# Patient Record
Sex: Female | Born: 1955 | Race: Black or African American | Hispanic: No | Marital: Married | Smoking: Never smoker
Health system: Southern US, Community
[De-identification: ages and names within clinical notes are randomized; demographics above are authoritative.]

## PROBLEM LIST (undated history)

## (undated) ENCOUNTER — Ambulatory Visit: Discharge status: Absent without leave | Payer: BC Managed Care – PPO

## (undated) DIAGNOSIS — I671 Cerebral aneurysm, nonruptured: Secondary | ICD-10-CM

## (undated) DIAGNOSIS — I10 Essential (primary) hypertension: Secondary | ICD-10-CM

## (undated) DIAGNOSIS — D72829 Elevated white blood cell count, unspecified: Secondary | ICD-10-CM

## (undated) DIAGNOSIS — E785 Hyperlipidemia, unspecified: Secondary | ICD-10-CM

## (undated) DIAGNOSIS — R519 Headache, unspecified: Secondary | ICD-10-CM

## (undated) HISTORY — PX: ABDOMINAL HYSTERECTOMY: SHX81

## (undated) HISTORY — DX: Cerebral aneurysm, nonruptured: I67.1

## (undated) HISTORY — DX: Hyperlipidemia, unspecified: E78.5

## (undated) HISTORY — DX: Elevated white blood cell count, unspecified: D72.829

## (undated) HISTORY — DX: Essential (primary) hypertension: I10

## (undated) HISTORY — PX: CEREBRAL ANEURYSM REPAIR: SHX164

## (undated) HISTORY — PX: OTHER SURGICAL HISTORY: SHX169

---

## 2005-03-23 ENCOUNTER — Other Ambulatory Visit: Admission: RE | Admit: 2005-03-23 | Discharge: 2005-03-23 | Payer: Self-pay | Admitting: Obstetrics and Gynecology

## 2005-04-05 ENCOUNTER — Encounter: Admission: RE | Admit: 2005-04-05 | Discharge: 2005-04-05 | Payer: Self-pay | Admitting: Obstetrics and Gynecology

## 2005-04-24 ENCOUNTER — Ambulatory Visit (HOSPITAL_COMMUNITY): Admission: RE | Admit: 2005-04-24 | Discharge: 2005-04-24 | Payer: Self-pay | Admitting: Gastroenterology

## 2005-04-24 ENCOUNTER — Encounter (INDEPENDENT_AMBULATORY_CARE_PROVIDER_SITE_OTHER): Payer: Self-pay | Admitting: Specialist

## 2005-04-26 ENCOUNTER — Encounter: Admission: RE | Admit: 2005-04-26 | Discharge: 2005-04-26 | Payer: Self-pay | Admitting: Obstetrics and Gynecology

## 2005-06-06 ENCOUNTER — Inpatient Hospital Stay (HOSPITAL_COMMUNITY): Admission: RE | Admit: 2005-06-06 | Discharge: 2005-06-09 | Payer: Self-pay | Admitting: Obstetrics and Gynecology

## 2005-06-06 ENCOUNTER — Encounter (INDEPENDENT_AMBULATORY_CARE_PROVIDER_SITE_OTHER): Payer: Self-pay | Admitting: *Deleted

## 2007-02-22 ENCOUNTER — Inpatient Hospital Stay (HOSPITAL_COMMUNITY): Admission: EM | Admit: 2007-02-22 | Discharge: 2007-02-28 | Payer: Self-pay | Admitting: Neurology

## 2007-02-22 ENCOUNTER — Encounter: Payer: Self-pay | Admitting: Emergency Medicine

## 2007-03-13 ENCOUNTER — Encounter: Payer: Self-pay | Admitting: Interventional Radiology

## 2007-04-10 ENCOUNTER — Encounter: Payer: Self-pay | Admitting: Interventional Radiology

## 2007-04-23 ENCOUNTER — Emergency Department (HOSPITAL_COMMUNITY): Admission: EM | Admit: 2007-04-23 | Discharge: 2007-04-23 | Payer: Self-pay | Admitting: Emergency Medicine

## 2007-04-24 ENCOUNTER — Encounter: Payer: Self-pay | Admitting: Interventional Radiology

## 2007-05-03 ENCOUNTER — Emergency Department (HOSPITAL_COMMUNITY): Admission: EM | Admit: 2007-05-03 | Discharge: 2007-05-03 | Payer: Self-pay | Admitting: Emergency Medicine

## 2007-05-22 ENCOUNTER — Ambulatory Visit: Payer: Self-pay | Admitting: Internal Medicine

## 2007-05-22 DIAGNOSIS — I671 Cerebral aneurysm, nonruptured: Secondary | ICD-10-CM

## 2007-05-22 DIAGNOSIS — I10 Essential (primary) hypertension: Secondary | ICD-10-CM

## 2007-05-22 DIAGNOSIS — E785 Hyperlipidemia, unspecified: Secondary | ICD-10-CM | POA: Insufficient documentation

## 2007-05-24 LAB — CONVERTED CEMR LAB
Albumin: 4.4 g/dL (ref 3.5–5.2)
Alkaline Phosphatase: 62 units/L (ref 39–117)
BUN: 7 mg/dL (ref 6–23)
Basophils Relative: 0.4 % (ref 0.0–1.0)
Calcium: 9.3 mg/dL (ref 8.4–10.5)
Creatinine, Ser: 0.8 mg/dL (ref 0.4–1.2)
Eosinophils Absolute: 0 10*3/uL (ref 0.0–0.7)
Eosinophils Relative: 0.9 % (ref 0.0–5.0)
GFR calc Af Amer: 97 mL/min
GFR calc non Af Amer: 80 mL/min
Glucose, Bld: 73 mg/dL (ref 70–99)
HCT: 41.4 % (ref 36.0–46.0)
Hemoglobin: 13.3 g/dL (ref 12.0–15.0)
MCV: 82.5 fL (ref 78.0–100.0)
Monocytes Absolute: 0.4 10*3/uL (ref 0.1–1.0)
Monocytes Relative: 10.5 % (ref 3.0–12.0)
Neutro Abs: 1.7 10*3/uL (ref 1.4–7.7)
Platelets: 280 10*3/uL (ref 150–400)
TSH: 0.53 microintl units/mL (ref 0.35–5.50)
Total Protein: 8.1 g/dL (ref 6.0–8.3)
WBC: 4.1 10*3/uL — ABNORMAL LOW (ref 4.5–10.5)

## 2007-06-24 ENCOUNTER — Inpatient Hospital Stay (HOSPITAL_COMMUNITY): Admission: RE | Admit: 2007-06-24 | Discharge: 2007-06-26 | Payer: Self-pay | Admitting: Interventional Radiology

## 2007-07-10 ENCOUNTER — Encounter: Payer: Self-pay | Admitting: Interventional Radiology

## 2007-09-16 ENCOUNTER — Ambulatory Visit (HOSPITAL_COMMUNITY): Admission: RE | Admit: 2007-09-16 | Discharge: 2007-09-16 | Payer: Self-pay | Admitting: Interventional Radiology

## 2008-01-22 ENCOUNTER — Inpatient Hospital Stay (HOSPITAL_COMMUNITY): Admission: RE | Admit: 2008-01-22 | Discharge: 2008-01-24 | Payer: Self-pay | Admitting: Interventional Radiology

## 2008-02-26 ENCOUNTER — Telehealth: Payer: Self-pay | Admitting: Internal Medicine

## 2008-02-28 ENCOUNTER — Encounter: Payer: Self-pay | Admitting: Interventional Radiology

## 2008-04-21 ENCOUNTER — Telehealth: Payer: Self-pay | Admitting: Internal Medicine

## 2008-06-05 ENCOUNTER — Ambulatory Visit (HOSPITAL_COMMUNITY): Admission: RE | Admit: 2008-06-05 | Discharge: 2008-06-05 | Payer: Self-pay | Admitting: Interventional Radiology

## 2008-12-16 ENCOUNTER — Ambulatory Visit (HOSPITAL_COMMUNITY): Admission: RE | Admit: 2008-12-16 | Discharge: 2008-12-16 | Payer: Self-pay | Admitting: Interventional Radiology

## 2009-02-26 ENCOUNTER — Telehealth: Payer: Self-pay | Admitting: Internal Medicine

## 2009-05-13 ENCOUNTER — Ambulatory Visit: Payer: Self-pay | Admitting: Internal Medicine

## 2009-05-14 LAB — CONVERTED CEMR LAB
BUN: 11 mg/dL (ref 6–23)
Chloride: 104 meq/L (ref 96–112)
Cholesterol: 203 mg/dL — ABNORMAL HIGH (ref 0–200)
GFR calc non Af Amer: 124.33 mL/min (ref >60)
Potassium: 4.1 meq/L (ref 3.5–5.1)
Sodium: 142 meq/L (ref 135–145)
Triglycerides: 86 mg/dL (ref 0.0–149.0)
VLDL: 17.2 mg/dL (ref 0.0–40.0)

## 2009-09-03 ENCOUNTER — Emergency Department (HOSPITAL_COMMUNITY): Admission: EM | Admit: 2009-09-03 | Discharge: 2009-09-03 | Payer: Self-pay | Admitting: Family Medicine

## 2009-09-27 ENCOUNTER — Ambulatory Visit (HOSPITAL_COMMUNITY): Admission: RE | Admit: 2009-09-27 | Discharge: 2009-09-27 | Payer: Self-pay | Admitting: Oncology

## 2009-12-08 ENCOUNTER — Emergency Department (HOSPITAL_COMMUNITY)
Admission: EM | Admit: 2009-12-08 | Discharge: 2009-12-08 | Payer: Self-pay | Source: Home / Self Care | Admitting: Emergency Medicine

## 2010-01-23 ENCOUNTER — Encounter: Payer: Self-pay | Admitting: Obstetrics and Gynecology

## 2010-02-01 NOTE — Assessment & Plan Note (Signed)
Summary: fu on bp/njr   Vital Signs:  Patient profile:   55 year old female Menstrual status:  hysterectomy Weight:      145 pounds BMI:     26.62 Temp:     98.8 degrees F oral Pulse rate:   68 / minute Pulse rhythm:   regular Resp:     12 per minute BP sitting:   126 / 88  (left arm) Cuff size:   regular  Vitals Entered By: Gladis Riffle, RN (May 13, 2009 9:22 AM) CC: FU BP--states BP ok at home Is Patient Diabetic? No     Menstrual Status hysterectomy   CC:  FU BP--states BP ok at home.  History of Present Illness:  Follow-Up Visit      This is a 55 year old woman who presents for Follow-up visit.  The patient denies chest pain and palpitations.  Since the last visit the patient notes no new problems or concerns.  The patient reports taking meds as prescribed.  When questioned about possible medication side effects, the patient notes none.    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 Once Daily 2)  Ibuprofen 200 Mg  Tabs (Ibuprofen) .... As Needed 3)  Lisinopril 10 Mg  Tabs (Lisinopril) .... Take 1 Tab By Mouth Daily  Allergies: 1)  ! Sulfamethoxazole (Sulfamethoxazole)  Past History:  Past Medical History: Last updated: 06-03-07 Hyperlipidemia Hypertension brain aneurysm  Past Surgical History: Last updated: 2007-06-03 Hysterectomy cerebral aneurysm repair vaginal deliveries x 3  Family History: Last updated: 2007-06-03 mother deceased "heart" 46s father deceased DM--60s Family History of CAD Female 1st degree relative <60 Family History Diabetes 1st degree relative  Social History: Last updated: 2007-06-03 Occupation: out of work since  aneurysm Single 3 kids healthy Never Smoked Alcohol use-no Regular exercise-no  Risk Factors: Exercise: no (06/03/2007)  Risk Factors: Smoking Status: never (05/13/2009)  Physical Exam  General:  alert and well-developed.   Head:  normocephalic and atraumatic.   Eyes:  pupils equal and pupils round.   Ears:  R ear normal and L ear normal.   Neck:  No deformities, masses, or tenderness noted. Chest Wall:  No deformities, masses, or tenderness noted. Lungs:  Normal respiratory effort, chest expands symmetrically. Lungs are clear to auscultation, no crackles or wheezes. Abdomen:  Bowel sounds positive,abdomen soft and non-tender without masses, organomegaly or hernias noted. Msk:  No deformity or scoliosis noted of thoracic or lumbar spine.   Pulses:  R radial normal and L radial normal.   Neurologic:  cranial nerves II-XII intact and gait normal.     Impression & Recommendations:  Problem # 1:  HYPERTENSION (ICD-401.9)  check labs today  BP at home 110s 60s Her updated medication list for this problem includes:    Lisinopril 10 Mg Tabs (Lisinopril) .Marland Kitchen... Take 1 tab by mouth daily  BP today: 126/88 Prior BP: 154/96 (06-03-2007)  Labs Reviewed: K+: 4.2 (Jun 03, 2007) Creat: : 0.8 (June 03, 2007)     Orders: Venipuncture (04540) TLB-BMP (Basic Metabolic Panel-BMET) (80048-METABOL)  Problem # 2:  CEREBRAL ANEURYSM (ICD-437.3) no recurrence  Problem # 3:  HYPERLIPIDEMIA (ICD-272.4)  needs labs drqwn Labs Reviewed: SGOT: 19 (06/03/07)   SGPT: 14 (Jun 03, 2007)  Orders: TLB-Lipid Panel (80061-LIPID) TLB-TSH (Thyroid Stimulating Hormone) (84443-TSH)  Complete Medication List: 1)  Aspirin Ec 325 Mg Tbec (Aspirin) .... Once daily 2)  Ibuprofen 200 Mg Tabs (Ibuprofen) .... As needed 3)  Lisinopril 10 Mg Tabs (Lisinopril) .... Take 1 tab by mouth daily  Patient Instructions: 1)  Please schedule a follow-up appointment in 6 months. Prescriptions: LISINOPRIL 10 MG   TABS (LISINOPRIL) Take 1 tab by mouth daily  #90 x 3   Entered and Authorized by:   Birdie Sons MD   Signed by:   Birdie Sons MD on 05/13/2009   Method used:   Electronically to        [facility] [address] [phone]* (retail)       [address]       [place], [place]  [address]       Ph: [phone] or [phone]       Fax: [phone]   RxID:   [id number]    Immunization History:  Tetanus/Td Immunization History:    Tetanus/Td:  historical (01/03/2007)

## 2010-02-01 NOTE — Progress Notes (Signed)
Summary: REFILL  Phone Note Refill Request Message from:  Fax from Pharmacy  Refills Requested: Medication #1:  LISINOPRIL 10 MG  TABS Take 1 tab by mouth daily. [facility] [phone]    FAX---[phone]  Initial call taken by: Warnell Forester,  February 26, 2009 9:08 AM  Follow-up for Phone Call        denied.  patient needs an office visit. Follow-up by: Kern Reap CMA Duncan Dull),  February 26, 2009 9:18 AM  Additional Follow-up for Phone Call Additional follow up Details #1::        faxed back as denied---needs ov. Additional Follow-up by: Warnell Forester,  February 26, 2009 9:42 AM     Appended Document: REFILL [facility]

## 2010-03-17 LAB — BASIC METABOLIC PANEL
CO2: 25 mEq/L (ref 19–32)
Chloride: 110 mEq/L (ref 96–112)
Creatinine, Ser: 0.76 mg/dL (ref 0.4–1.2)
GFR calc Af Amer: >60 mL/min (ref >60)
Glucose, Bld: 97 mg/dL (ref 70–99)

## 2010-03-17 LAB — URINE CULTURE
Colony Count: 85000
Culture  Setup Time: 201109021220

## 2010-03-17 LAB — POCT URINALYSIS DIPSTICK
Bilirubin Urine: NEGATIVE
Ketones, ur: NEGATIVE mg/dL
Protein, ur: NEGATIVE mg/dL
pH: 6 (ref 5.0–8.0)

## 2010-03-17 LAB — CBC
Hemoglobin: 13 g/dL (ref 12.0–15.0)
MCH: 26.2 pg (ref 26.0–34.0)
MCHC: 32.4 g/dL (ref 30.0–36.0)
MCV: 80.8 fL (ref 78.0–100.0)
RBC: 4.96 MIL/uL (ref 3.87–5.11)

## 2010-04-05 LAB — BASIC METABOLIC PANEL
Calcium: 8.6 mg/dL (ref 8.4–10.5)
Creatinine, Ser: 0.67 mg/dL (ref 0.4–1.2)
GFR calc Af Amer: >60 mL/min (ref >60)
GFR calc non Af Amer: >60 mL/min (ref >60)

## 2010-04-05 LAB — PROTIME-INR
INR: 0.95 (ref 0.00–1.49)
Prothrombin Time: 12.6 seconds (ref 11.6–15.2)

## 2010-04-05 LAB — CBC
MCHC: 33.5 g/dL (ref 30.0–36.0)
RBC: 4.88 MIL/uL (ref 3.87–5.11)
WBC: 3.2 10*3/uL — ABNORMAL LOW (ref 4.0–10.5)

## 2010-04-05 LAB — APTT: aPTT: 22 seconds — ABNORMAL LOW (ref 24–37)

## 2010-04-11 LAB — BASIC METABOLIC PANEL
Calcium: 9.2 mg/dL (ref 8.4–10.5)
GFR calc Af Amer: >60 mL/min (ref >60)
GFR calc non Af Amer: >60 mL/min (ref >60)
Glucose, Bld: 95 mg/dL (ref 70–99)
Sodium: 142 mEq/L (ref 135–145)

## 2010-04-11 LAB — CBC
Hemoglobin: 13.2 g/dL (ref 12.0–15.0)
Platelets: 250 10*3/uL (ref 150–400)
RDW: 13.5 % (ref 11.5–15.5)
WBC: 3 10*3/uL — ABNORMAL LOW (ref 4.0–10.5)

## 2010-04-11 LAB — APTT: aPTT: 26 seconds (ref 24–37)

## 2010-04-11 LAB — PROTIME-INR: INR: 1 (ref 0.00–1.49)

## 2010-04-18 LAB — CBC
HCT: 33.4 % — ABNORMAL LOW (ref 36.0–46.0)
Hemoglobin: 10.9 g/dL — ABNORMAL LOW (ref 12.0–15.0)
MCHC: 32.5 g/dL (ref 30.0–36.0)
Platelets: 273 10*3/uL (ref 150–400)
Platelets: 248 10*3/uL (ref 150–400)
RBC: 4.36 MIL/uL (ref 3.87–5.11)
WBC: 4.2 10*3/uL (ref 4.0–10.5)
WBC: 5 10*3/uL (ref 4.0–10.5)
WBC: 5.7 10*3/uL (ref 4.0–10.5)

## 2010-04-18 LAB — BASIC METABOLIC PANEL
BUN: 10 mg/dL (ref 6–23)
Calcium: 8.8 mg/dL (ref 8.4–10.5)
Creatinine, Ser: 0.81 mg/dL (ref 0.4–1.2)
Creatinine, Ser: 0.53 mg/dL (ref 0.4–1.2)
GFR calc Af Amer: >60 mL/min (ref >60)
GFR calc non Af Amer: >60 mL/min (ref >60)
Potassium: 4.2 mEq/L (ref 3.5–5.1)

## 2010-04-18 LAB — HEPARIN LEVEL (UNFRACTIONATED)
Heparin Unfractionated: 0.3 IU/mL (ref 0.30–0.70)
Heparin Unfractionated: 0.51 IU/mL (ref 0.30–0.70)

## 2010-04-18 LAB — APTT: aPTT: 33 seconds (ref 24–37)

## 2010-04-18 LAB — DIFFERENTIAL
Lymphocytes Relative: 47 % — ABNORMAL HIGH (ref 12–46)
Lymphs Abs: 2 10*3/uL (ref 0.7–4.0)
Neutrophils Relative %: 42 % — ABNORMAL LOW (ref 43–77)

## 2010-04-18 LAB — PROTIME-INR
INR: 0.9 (ref 0.00–1.49)
Prothrombin Time: 12.1 seconds (ref 11.6–15.2)

## 2010-05-17 NOTE — H&P (Signed)
Krystal Houston, Krystal Houston               ACCOUNT NO.:  [id number]   MEDICAL RECORD NO.:  [id number]          PATIENT TYPE:  OIB   LOCATION:  3112                         FACILITY:  [facility]   PHYSICIAN:  Sanjeev K. Deveshwar, M.D.DATE OF BIRTH:  11-03-1955   DATE OF ADMISSION:  06/24/2007  DATE OF DISCHARGE:                              HISTORY & PHYSICAL   CHIEF COMPLAINT:  Cerebral aneurysm.   HISTORY OF PRESENT ILLNESS:  This is a pleasant 55 year old female who  is referred to Dr. Corliss Skains through the courtesy of Dr. Sharene Skeans.  The  patient had a cerebral angiogram performed by Dr. 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  The  patient underwent stent-assisted coiling of the larger of the 2  aneurysms on February 27, 2007.  She returns today to be admitted for a  repeat cerebral angiogram and possible coiling of the right internal  carotid artery aneurysm.   PAST MEDICAL HISTORY:  Significant for hypertension, anemia,  diverticular disease, and the above-noted aneurysms.   SURGICAL HISTORY:  The patient is status post hysterectomy and status  post bilateral tubal ligation.   ALLERGIES:  She is allergic to SULFA.   CURRENT MEDICATIONS:  The patient did not bring a list of her  medication.  She did bring the pills, although they are not in the  original bottles.  She states she is on a blood pressure medicine  started by Dr. Cato Mulligan.  She also has been taking her aspirin and Plavix  as instructed in anticipation of possible stenting.   SOCIAL HISTORY:  The patient is separated.  She has 3 children.  She is  currently unemployed.  She does not use alcohol or tobacco.   FAMILY HISTORY:  Her mother died from heart disease at age 63.  Her  father died from complications of diabetes at age 44.  There  is no known  family history of aneurysms.   REVIEW OF SYSTEMS:  Completely negative.   LABORATORY DATA:  An INR is 0.9, PTT is 26, BUN is 9, creatinine 0.77,  and GFR greater than 60.  CBC revealed hemoglobin 13.1, hematocrit 39.3,  WBC is 3300, and platelets 252,000.   PHYSICAL EXAM:  GENERAL:  Reveals a pleasant 55 year old African  American female in no acute distress.  HEENT:  Unremarkable.  NECK:  Revealed no bruits.  HEART:  Revealed regular rate and rhythm without murmur.  LUNGS:  Clear.  ABDOMEN:  Soft and nontender.  EXTREMITIES:  Reveal pulses to be intact, although the pulses of the  right lower extremity appear to be somewhat stronger than the left.  There is no significant edema.  Her airway was rated at 68.  Her ASA  scale was rated at 3.  NEUROLOGIC:  Mental status, the patient was alert and oriented, follows  commands.  Cranial nerves II through XII grossly intact.  Sensation was  intact to light  touch.  Motor strength was 5/5 throughout.  Cerebellar  testing was intact.   IMPRESSION:  1. Status post stent-assisted coiling of a large left internal carotid      artery aneurysm performed on February 27, 2007, by Dr. Corliss Skains.  2. 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  3. History of anemia.  4. History of hypertension.  5. History of diverticular disease.  6. History of sinusitis.  7. Status post tubal ligation and hysterectomy.  8. History of allergy to SULFA.   PLAN:  As noted, the patient will have a repeat cerebral angiogram today  and possible stenting and/or coiling of her right internal carotid  artery aneurysm to be performed under general anesthesia by Dr.  Corliss Skains.      Delton See, P.A.    ______________________________  Grandville Silos. Corliss Skains, M.D.    DR/MEDQ  D:  06/24/2007  T:  06/25/2007  Job:  [id number]   cc:   Valetta Mole. Swords, MD   [name], M.D.  [name], M.D.

## 2010-05-17 NOTE — Discharge Summary (Signed)
Krystal Houston, Krystal Houston               ACCOUNT NO.:  [id number]   MEDICAL RECORD NO.:  [id number]          PATIENT TYPE:  INP   LOCATION:  3112                         FACILITY:  [facility]   PHYSICIAN:  Sanjeev K. Deveshwar, M.D.DATE OF BIRTH:  09-01-1955   DATE OF ADMISSION:  06/24/2007  DATE OF DISCHARGE:  06/26/2007                               DISCHARGE SUMMARY   ADDENDUM   Please refer to discharge summary dictated on June 25, 2007.  The  patient was kept in the hospital an extra day and was not discharged  until June 26, 2007, because of some bleeding from her groin, which  occurred following her bedrest after the right femoral groin sheath had  been removed.  Due to the unstable nature of this bleed, Dr. Corliss Houston  felt that it would be best to keep the patient in the hospital an extra  day.   The patient also complained of some urinary symptoms.  A urinalysis was  performed prior to discharge and was completely negative.      Krystal Houston, P.A.    ______________________________  Krystal Houston, M.D.    DR/MEDQ  D:  06/27/2007  T:  06/28/2007  Job:  [id number]

## 2010-05-17 NOTE — Discharge Summary (Signed)
Krystal Houston, Krystal Houston               ACCOUNT NO.:  [id number]   MEDICAL RECORD NO.:  [id number]          PATIENT TYPE:  OIB   LOCATION:  3112                         FACILITY:  [facility]   PHYSICIAN:  Sanjeev K. Deveshwar, M.D.DATE OF BIRTH:  1955-12-10   DATE OF ADMISSION:  06/24/2007  DATE OF DISCHARGE:  06/25/2007                               DISCHARGE SUMMARY   CHIEF COMPLAINT:  Cerebral aneurysms.   HISTORY OF PRESENT ILLNESS:  This is a pleasant 55 year old female who  was referred to Dr. Corliss Skains through the courtesy of Dr. Sharene Skeans.  The  patient had a cerebral angiogram performed by Dr. 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  The  patient underwent stent assisted coiling of the larger of the 2  aneurysms on February 27, 2007.  She was readmitted to [facility] on June 24, 2007, for treatment of the remaining right internal  carotid artery aneurysm.   PAST MEDICAL HISTORY:  Significant for hypertension, anemia,  diverticular disease, and the above-noted aneurysms.   SURGICAL HISTORY:  The patient is status post hysterectomy, status post  bilateral tubal ligation.   ALLERGIES:  She is allergic to SULFA.   MEDICATIONS:  The patient was on aspirin 1 daily, mg dose is not  available; Lotensin 10 mg daily.  She was started on Plavix in  anticipation of this procedure.  She also takes a multivitamin daily and  ibuprofen.   SOCIAL HISTORY:  The patient is separated.  She has 3 children.  She is  currently unemployed.  She does not use alcohol or tobacco.   FAMILY HISTORY:  Her mother died from heart disease at age 66.  Her  father died from complications of diabetes at age 27.  There is no known  family history of cerebral aneurysms.   HOSPITAL COURSE:  As noted, this patient was  admitted to [facility] on June 24, 2007, for treatment of a remaining right internal  carotid artery aneurysm.  A cerebral angiogram was performed under  conscious sedation on the day of admission.  The aneurysm was once again  identified.  Dr. Corliss Skains recommended stent-assisted coiling.  A stent  was placed; however, the coiling could not be accomplished.  It was felt  that the stent would need time to endothelialize and the coiling could  possibly be performed at a later date.   Following the procedure, the patient was admitted to the neurointensive  care unit where she remained on IV heparin overnight.  The following  day, the heparin was discontinued.  She did complain of some mild  headaches; however, they had resolved by the following morning.  The  right femoral groin sheath was removed.  She was noted to be mildly  anemic, which was felt secondary to hydration with IV fluids and minimal  blood loss.  Plan at this time is to continue bedrest for 6 hours,  after  bedrest she will be ambulated.  If she remains stable, plan will be for  discharge later this afternoon.   LABORATORY DATA:  A CBC on June 20, 2007, revealed hemoglobin 13.1,  hematocrit 39.3, WBCs were 3300, and platelets 252,000.  A CBC on the  day of discharge revealed hemoglobin 10.7, hematocrit 31.5, WBCs 4000,  and platelets 217,000.  A basic metabolic panel on the day of discharge  revealed BUN 6, creatinine 0.8, and potassium was 3.6.  GFR was greater  than 60, glucose was 88, and sodium 141.   DISCHARGE MEDICATIONS:  The patient was told to continue the same  medications she was on prior to admission.  She will continue on Plavix  until told to stop this medication by Dr. Corliss Skains.   The patient was told to resume her previous diet.  She was given  instructions regarding wound care.  She was not to do anything strenuous  including driving for at least 2 weeks.   The patient was told to follow up  with Dr. Cato Mulligan as needed or as  instructed.  She would follow up with Dr. Corliss Skains in approximately 2  weeks.  She will probably have a repeat cerebral angiogram in 3 months.   DISCHARGE DIAGNOSES:  1. Status post stenting of a 2- to 3-mm cavernous cave aneurysm of the      right internal carotid artery performed on June 24, 2007.  2. Status post stent assisted coiling of a large left internal carotid      artery aneurysm performed on February 27, 2007.  3. History of hypertension.  4. Mild anemia.  5. Diverticular disease.  6. History of sinusitis.  7. Status post tubal ligation and hysterectomy.  8. History of allergy to SULFA.      Delton See, P.A.    ______________________________  Grandville Silos. Corliss Skains, M.D.    DR/MEDQ  D:  06/25/2007  T:  06/26/2007  Job:  [id number]   cc:   Valetta Mole. Swords, MD  [name], M.D.  [name], M.D.

## 2010-05-17 NOTE — Consult Note (Signed)
NAMESALIHAH, PECKHAM               ACCOUNT NO.:  [id number]   MEDICAL RECORD NO.:  [id number]          PATIENT TYPE:  OUT   LOCATION:  XRAY                         FACILITY:  [facility]   PHYSICIAN:  Sanjeev K. Deveshwar, M.D.DATE OF BIRTH:  01-19-1955   DATE OF CONSULTATION:  04/24/2007  DATE OF DISCHARGE:                                 CONSULTATION   DATE OF CONSULT:  April 24, 2007   CHIEF COMPLAINT:  Status post stent assisted coiling of a left internal  carotid artery aneurysm.   BRIEF HISTORY:  This is a very pleasant 55 year old female who was  referred to Dr. Corliss Skains through the courtesy of Dr. Sharene Skeans.  The  patient had had a cerebral angiogram performed by Dr. Alfredo Batty on  February 23, 2007.  It revealed a 14-mm mostly thrombosed left cavernous  carotid artery aneurysm that involved a long segment of the internal  carotid artery estimated to be 10 mm.  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  On  February 27, 2007, the patient underwent stent assisted coiling of the  left internal carotid artery aneurysm performed by Dr. Corliss Skains.  She  was seen in follow-up on at least two different occasions.  At one  point, she was having some headaches and pain behind her left eye.  She  was told to take ibuprofen on the followup visit.  She reported that her  symptoms had resolved.   We received a phone call from the patient yesterday saying that she felt  strange she lives alone.  She felt as if she was going to pass out.  She  had odd sensations in her head which she could not describe.  She was  told to come to [facility] for further  evaluation which she did.   The patient was evaluated in the emergency room last night.  A CT scan  was performed that showed no acute findings.  The patient also had some  other lab work performed which was unremarkable except for urinalysis  which was possibly  consistent with urinary tract infection.  The patient  was told to followup with Dr. Corliss Skains today.  She presents with her  sister for that followup.   PAST MEDICAL HISTORY:  1. Hypertension.  2. Anemia.  3. Diverticular disease.  4. Above-noted aneurysms.   SURGICAL HISTORY:  The patient is status post hysterectomy.   ALLERGIES:  She is allergic to SULFA.   SOCIAL HISTORY:  The patient is separated.  She has three children.  She  is currently unemployed.  She does not use alcohol or tobacco.   FAMILY HISTORY:  Her mother died from heart disease at age 50.  Her  father died from complications of diabetes at age 89.  There is no known  family history of aneurysms.   CURRENT MEDICATIONS:  1. Ibuprofen p.r.n.  2. Aspirin 325 mg daily.  3. She had previously been on Plavix which was discontinued at one of      her visits with  Dr. Corliss Skains.   IMPRESSION AND PLAN:  As noted, the patient presents today accompanied  by her sister to meet with Dr. Corliss Skains after being seen in the  emergency department last night with odd sensations in her head.  The  chief complaint from the emergency department was severe headache,  although the patient denies having had a headache though she stated her  blood pressure was elevated which was at 157/99.  The patient does not  have a primary care physician.  We reviewed her labs which were  essentially unremarkable as was the CT scan of the head.  The urinalysis  did show findings consistent with a possible urinary tract infection.  Since the patient is allergic to sulfa, we did give her a prescription  for Cipro 250 mg to be taken one b.i.d. to be taken for 5 days.  The  patient was told to call us back in approximately a week or two to see  if her symptoms have resolved.   She has been tentatively scheduled for treatment of her remaining  aneurysm at the end of May.   The patient also reports that she stays tired and fatigued.  She has  also  had memory problems and difficulty concentrating.  Dr. Corliss Skains  did not feel that an MRI was indicated at this time.  However, if her  symptoms continued, he will consider this in the future.  We consider  the possibility of depression as a cause of her symptoms of fatigue and  memory problems.  Dr. Corliss Skains did not feel that it was related to her  aneurysms.  She may need a follow-up visit with Dr. Sharene Skeans.  She no  doubt needs a primary care physician.  Greater than 30 minutes was spent  on this consult.      Delton See, P.A.    ______________________________  Grandville Silos. Corliss Skains, M.D.    DR/MEDQ  D:  04/24/2007  T:  04/25/2007  Job:  [id number]   cc:   [name], M.D.  [name], M.D.

## 2010-05-17 NOTE — Discharge Summary (Signed)
NAMEJAZZELLE, Krystal Houston               ACCOUNT NO.:  [id number]   MEDICAL RECORD NO.:  [id number]          PATIENT TYPE:  INP   LOCATION:  3104                         FACILITY:  [facility]   PHYSICIAN:  Sanjeev K. Deveshwar, M.D.DATE OF BIRTH:  [birth year]   DATE OF ADMISSION:  01/22/2008  DATE OF DISCHARGE:  01/24/2008                               DISCHARGE SUMMARY   ADDENDUM   BRIEF HISTORY:  Please see the discharge summary dictated on January 23, 2008.  The plan was for the patient to be discharged home that evening;  however after ambulating with the nurse on the unit, the patient had  some dizziness.  She also complained of transient left lower extremity  weakness.  Dr. Corliss Skains was contacted.  A decision was made to place  the patient on IV heparin and to keep her overnight.  A CT scan of the  head showed no acute findings.  The patient felt significantly better  the following day.  The heparin was discontinued.  The patient remained  stable.  A decision was made to proceed with discharge per Dr.  Corliss Skains.      Delton See, P.A.    ______________________________  Grandville Silos. Corliss Skains, M.D.    DR/MEDQ  D:  01/24/2008  T:  01/24/2008  Job:  [id number]

## 2010-05-17 NOTE — Discharge Summary (Signed)
NAMEHINA, GUPTA               ACCOUNT NO.:  [id number]   MEDICAL RECORD NO.:  [id number]          PATIENT TYPE:  INP   LOCATION:  3104                         FACILITY:  [facility]   PHYSICIAN:  Sanjeev K. Deveshwar, M.D.DATE OF BIRTH:  13-Feb-1955   DATE OF ADMISSION:  01/22/2008  DATE OF DISCHARGE:  01/23/2008                               DISCHARGE SUMMARY   CHIEF COMPLAINT:  Cerebral aneurysms.   HISTORY OF PRESENT ILLNESS:  This is a pleasant 55 year old female who  was referred to Dr. Corliss Skains through the courtesy of Dr. Sharene Skeans.  The  patient had a cerebral angiogram performed by Dr. 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   On February 29, 2009, the patient underwent stent assisted coiling of  the larger of the two aneurysms performed by Dr. Corliss Skains.  She was  later re-admitted to [facility] on June 24, 2007 for treatment  of the remaining right internal carotid artery.  At that time, a stent  was placed.  It was felt that coiling was too risky and could possibly  be performed at a later time as a staged procedure.   The patient had a followup cerebral angiogram on September 16, 2007 that  showed near complete obliteration of the giant left paracavernous  aneurysm treated with stent assisted coiling.  There was an interval  decrease in the neck remnant along the inferior medial aspect of the  giant aneurysm.  The other aneurysm measured 2.5 mm x 2.1 mm in the  right internal carotid artery superior hypophyseal region and had a  patent stent at the base.  It was felt that the aneurysm was essentially  unchanged and further intervention was recommended.  The patient was  admitted to [facility] January 22, 2008 for further evaluation.   PAST MEDICAL HISTORY:  Please  see history above.  The patient also has a  history of hypertension, anemia and diverticular disease.   PAST SURGICAL HISTORY:  Significant for a hysterectomy as well as a  bilateral tubal ligation.  She denies any previous problems with  anesthesia.   ALLERGIES:  The patient is allergic to SULFA.   MEDICATIONS AT THE TIME OF ADMISSION:  Lisinopril, aspirin and Plavix.  The Plavix was started 3 days prior to admission in anticipation of  possible stent placement.  We also called and a prescription for  lisinopril for the patient at that time as she had run out of this  medication.   SOCIAL HISTORY:  The patient is separated.  She has three children.  She  is currently unemployed.  She does not use alcohol or tobacco.   FAMILY HISTORY:  Her mother died from heart disease at age 16.  Her  father died from complications of diabetes at age 8.  There is no known  family history of cerebral aneurysms.   HOSPITAL COURSE:  As noted, this patient was admitted  to [facility] on January 22, 2008 for further evaluation of a right internal  carotid artery aneurysm that had previously been stented.  She underwent  a cerebral angiogram shortly after admission.  Dr. Corliss Skains was unable  to coil the aneurysm instead a second stent was placed within the first  stent.  Dr. Corliss Skains felt strongly that this would divert enough flow  away from the aneurysm and the aneurysm would gradually shrink.   The patient tolerated the procedure well.  She was admitted to the neuro  intensive care unit and remained on IV heparin overnight.  The following  day, the IV heparin was discontinued.  The right femoral groin sheath  was removed.  The patient was ambulated.  She did have some mild  dizziness despite having a blood pressure of 140s systolic.  She was  given an extra 250 mL of normal saline prior to discharge.  Her  potassium was also noted to be low.  This was supplemented prior to  discharge.   She was also mildly anemic.  However, there were no signs of  ongoing bleeding.  Decision was made to ambulate the patient once again  and if stable to proceed with discharge.   LABORATORY DATA:  On the day of discharge, the basic metabolic panel  revealed a BUN of 5, creatinine 0.53, GFR was greater than 60, potassium  was 3.4, glucose was 129.  A CBC on the January 23, 2008 revealed  hemoglobin 11.5, hematocrit 35.4, WBCs 5.7000, platelets 260,000.  On  January 17, 2008 hemoglobin had been 12.9 and hematocrit 38.7.   The patient was told to resume her home medications as previously.  She  was also told to stay on Plavix until instructed otherwise by Dr.  Corliss Skains.  She is to continue aspirin 81 mg daily.   The patient was told to stay on a low-sodium diet.  She was given  instructions regarding wound care.  She was told not to drive for at  least 2 weeks.  She is to avoid any strenuous activity for 2 weeks.  A  followup angiogram was recommended in 3 months.  The patient would see  Dr. Corliss Skains Wednesday, February 05, 2008 at 2 p.m.Marland Kitchen  She was told to  follow up with Dr. Cato Mulligan as scheduled.  She was to have her blood  pressure evaluated as well as a CBC blood test to follow up on her  anemia.   PROGNOSIS AT THE TIME OF DISCHARGE:  1. Status post placement of a second stent in a previously stented      right internal carotid artery aneurysm performed on the day of      admission.  2. Previous stent to the right internal carotid artery performed on      June 24, 2007.  3. Stent-assisted coiling of a giant left cavernous carotid artery      aneurysm performed February 27, 2007.  4. History of hypertension with poor compliance.  5. History of mild anemia with mildly low hemoglobin and hematocrit      following her procedure.  6. History of diverticular disease.  7. Status post hysterectomy and bilateral tubal ligation.  8. Mildly low potassium supplemented prior to discharge.  9.  History of chronic sinusitis.      Delton See, P.A.    ______________________________  Grandville Silos. Corliss Skains, M.D.   DR/MEDQ  D:  01/23/2008  T:  01/24/2008  Job:  [id number]   cc:  Bruce Rexene Edison Swords, MD  [name], M.D.

## 2010-05-17 NOTE — Consult Note (Signed)
[name], [place] NO.:  [id number]   MEDICAL RECORD NO.:  [id number]          PATIENT TYPE:  EMS   LOCATION:  ED                           FACILITY:  [facility]   PHYSICIAN:  Deanna Artis. Hickling, M.D.DATE OF BIRTH:  [birth year]   DATE OF CONSULTATION:  02/22/2007  DATE OF DISCHARGE:                                 CONSULTATION   CHIEF COMPLAINT:  Headache.   HISTORY OF PRESENT ILLNESS:  The patient is a 55 year old right-handed  African American woman with a 3-day history of left retro-orbital pain,  referring to the temporoparietal and mastoid region and also the left  neck when she turns it.   The patient was seen on February 19, at [facility].  Diagnosed with  migraine, despite the fact that these symptoms are new, and she has  never experienced migraine.  She was treated with nabumetone 500 mg  b.i.d., which provided some relief.  Her pain waxes and wanes.  She has  a pressure-like pain, which is not pounding.  She denies nausea and  vomiting, especially to light or sound or movement.  The patient has  complained of some blurred vision in the left eye and has had little  black dots in her eye that were present earlier in the week but are gone  at this time.  These acted like floaters but have not persisted.   The patient was seen at [facility] this morning.  CT scan of the brain  suggested left carotid cavernous aneurysm.  MRI scan of the brain  without and with contrast and MRA confirm the diagnosis.  There appears  to be some clot in the aneurysmal dilatation within the left cavernous  sinus.  The distal left carotid emerges from the cavernous sinus of  normal in size, and the proximal carotid is also of normal size for  entering the cavernous sinus.  The lesion is somewhat global and  somewhat irregular and suggests that there may be some clot within it.  The patient also has severe right maxillary sinusitis with the maxillary  sinus completely filled  with fluid.   PAST MEDICAL HISTORY:  Negative.   PAST SURGICAL HISTORY:  She is a gravida 3, para 3, and has had a  hysterectomy.   REVIEW OF SYSTEMS:  CONSTITUTIONAL:  Normal sleep and appetite.  HEAD  AND NECK:  No otitis media, pharyngitis.  She does have sinusitis now.  PULMONARY:  No asthma, bronchitis, or pneumonia.  CARDIOVASCULAR:  No  hypertension, murmur, or congenital heart disease (though she had  hypertension on arrival here today).  GASTROINTESTINAL:  No nausea,  vomiting, diarrhea, constipation, or hepatitis.  GENITOURINARY:  No  urinary tract infection or hematuria.  MUSCULOSKELETAL:  No fractures,  sprains, or deformities.  SKIN:  No rash or neurocutaneous lesions.  HEMATOLOGIC:  No anemia, bruisability, or lymphadenopathy.  ENDOCRINE:  No diabetes or thyroid disease. NEUROPSYCHIATRIC:  The patient has had  retro-orbital headache as noted above.  No diplopia, dysarthria,  dysphagia, numbness in her face, tinnitus, syncope, vertigo, weakness,  numbness, tingling, or loss of bowel  or bladder control.  NEUROLOGIC:  No seizures.   ALLERGIES:  NO KNOWN ENVIRONMENTAL ALLERGIES.   FAMILY HISTORY:  Mother died of heart disease at age 46.  Father died of  complications of diabetes at age 12.  The patient has siblings who have  diabetes, hypertension, and obesity.  No history of aneurysm.   SOCIAL HISTORY:  The patient is separated.  She has 3 children.  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  She does not use tobacco,  alcohol, or drugs.  She is here with her older sister.   PHYSICAL EXAMINATION:  VITAL SIGNS:  Temperature 97.8, blood pressure  105/97 (previously on admission 143/112), resting pulse 63, respirations  20, oxygen saturation 97%.  HEENT:  No proptosis.  She has tender right maxillary and left orbital  regions.  No obvious infection.  No meningismus.  No cervical or orbital  bruits.  LUNGS:  Clear.  HEART:  No murmurs.  Pulses normal.  ABDOMEN:  Soft.   Bowel sounds normal.  No hepatosplenomegaly.  EXTREMITIES:  Normal.   NEUROLOGIC EXAMINATION:  MENTAL STATUS:  The patient is awake, alert,  and oriented without dysphasia or dyspraxia.  CRANIAL NERVES:  Round and reactive pupils.  Visual fields full to  double simultaneous stimuli.  Symmetric facial strength.  Midline tongue  and uvula.  Air conduction greater than bone conduction bilaterally.  Visual acuity 20/40 OS, 20/30 OD.  No afferent pupillary defect.  MOTOR:  Normal strength, tone, and mass.  Good fine motor movements.  No  pronator drift.  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  No hemisensory  lesion.  She had good stereoagnosis, purpose, proprioception, and  vibratory sensation in the arms and legs.  CEREBELLAR:  Good finger-to-nose, heel-knee-shin, and rapid repetitive  movements.  Gait not tested.  Deep tendon reflexes were normal to brisk  in the upper extremities.  The ankles were diminished.  The patient had  bilateral full flexor plantar responses.   IMPRESSION:  Unruptured left carotid cavernous artery aneurysm.   PLAN:  Transfer to [facility] for angiogram on February 23, 2007.  Alert emergently if she shows signs of rupture with proptosis and  increased pain.  This was discussed Marin Roberts, MD.  We will  likely try stenting and coiling this lesion if it is favorable.  I will  obtain a neurosurgery consult after the angiogram.  The benefits and  risks have been explained to the patient who agrees to proceed with this  diagnostic workup and therapy.      [name], M.D.  Electronically Signed     [facility]/MEDQ  D:  02/22/2007  T:  02/23/2007  Job:  [id number]

## 2010-05-17 NOTE — H&P (Signed)
NAMEJANI, MORONTA               ACCOUNT NO.:  [id number]   MEDICAL RECORD NO.:  [id number]          PATIENT TYPE:  AMB   LOCATION:  SDS                          FACILITY:  [facility]   PHYSICIAN:  Sanjeev K. Deveshwar, M.D.DATE OF BIRTH:  1955/07/05   DATE OF ADMISSION:  01/22/2008  DATE OF DISCHARGE:                              HISTORY & PHYSICAL   CHIEF COMPLAINT:  Cerebral aneurysms.   HISTORY OF PRESENT ILLNESS:  This is a pleasant 55 year old female who  was referred to Dr. Corliss Skains through the courtesy of Dr. Sharene Skeans.  The  patient had a cerebral angiogram performed by Dr. Marin Roberts on  February 23, 2007, that revealed two cerebral aneurysms.  One was a 14-  mm, mostly thrombosed, left cavernous carotid artery aneurysm.  The  other was a 2-3-mm cavernous cave aneurysm of the right internal carotid  artery.   On February 27, 2007, the patient underwent stent-assisted coiling of  the larger of the two aneurysms, performed by Dr. Corliss Skains.  She was  later readmitted to [facility] on June 24, 2007, for treatment  of the remaining right internal carotid artery.  A stent was placed,  however the aneurysm was not coiled; it was felt that the aneurysm could  possibly be coiled as a staged procedure in the future.   The patient had a follow-up cerebral angiogram on September 16, 2007.  This showed near complete obliteration of the giant left pericavernous  aneurysm, treated with stent-assisted coiling.  There was an interval  decrease in the neck remnant along the inferior medial aspect of the  giant aneurysm.  The patient had a stable appearing 2.5-mm x 2.1-mm  right internal carotid artery superior hypophyseal region aneurysm with  a patent stent at the base.  It was felt that if the right internal  carotid artery superior hypophyseal region aneurysm remained unchanged  endovascular treatment with coiling and/or stenting may be undertaken in  the future.  The  patient was admitted to [facility] today,  January 22, 2008, for a repeat cerebral angiogram and possible  intervention.   PAST MEDICAL HISTORY:  The patient has a history of the above-noted  cerebral aneurysms, she has hypertension, she has a history of anemia,  she has diverticular disease.   PAST SURGICAL HISTORY:  The patient is status post hysterectomy, status  post bilateral tubal ligation.  She denies any previous problems with  anesthesia.   ALLERGIES:  She is allergic to SULFA.   MEDICATIONS AT TIME OF ADMISSION:  Included lisinopril, aspirin and  Plavix.  The Plavix was started 3 days prior to admission in  anticipation of possible stent placement.  The patient was called in a  prescription for lisinopril several days prior to admission as well, as  she had run out and had not had this medication filled.   SOCIAL HISTORY:  The patient is separated.  She has 3 children.  She is  currently unemployed.  She does not use alcohol or tobacco.   FAMILY HISTORY:  Per mother died from heart disease at age 61.  Her  father died from complications of diabetes at age 70.  There is no known  family history of cerebral aneurysms.   REVIEW OF SYSTEMS:  Was completely negative at time of admission.   LABORATORY DATA:  An INR was 0.9, a PTT was 25, BUN was 10, creatinine  0.81, potassium 4.2, glucose 91, GFR was greater than 60, hemoglobin was  12.9, hematocrit 38.7, WBCs 4.2 thousand, platelets 273,000.   PHYSICAL EXAM:  Revealed a pleasant 55 year old African American female  in no acute distress.  VITAL SIGNS:  Blood pressure 139/82, pulse 70, respirations 18,  temperature 98.3, oxygen saturation 97.9.  HEENT:  Unremarkable.  NECK:  Revealed no bruits.  HEART:  Revealed a regular rate and rhythm without murmur.  LUNGS:  Clear but decreased.  ABDOMEN:  Nontender.  EXTREMITIES:  Revealed pulses to be slightly weak but intact.  Her  airway was rated at a 1, her ASA scale  was rated at a 3.  NEUROLOGICAL:  Was within normal limits with motor strength estimated  5/5 throughout.   IMPRESSION:  1. Previous stent-assisted coiling of left internal carotid artery      aneurysm performed February 27, 2007.  2. Previous stent for a right internal carotid artery aneurysm      performed June 24, 2007.  3. Recent angiogram performed September 16, 2007, revealing no      significant change in the right internal carotid artery aneurysm      with plans for repeat angiogram today and possible intervention.  4. History of chronic sinusitis.  5. History of hypertension.  6. History of anemia.  7. History of diverticular disease.  8. Status post hysterectomy and bilateral tubal ligation.      Delton See, P.A.    ______________________________  Grandville Silos. Corliss Skains, M.D.    DR/MEDQ  D:  01/22/2008  T:  01/22/2008  Job:  [id number]   cc:   Valetta Mole. Swords, MD  [name], M.D.

## 2010-05-17 NOTE — Consult Note (Signed)
Krystal [place], Krystal [place]               ACCOUNT NO.:  [id number]   MEDICAL RECORD NO.:  [id number]          PATIENT TYPE:  OUT   LOCATION:  XRAY                         FACILITY:  [facility]   PHYSICIAN:  Sanjeev K. Deveshwar, M.D.DATE OF BIRTH:  1955-03-28   DATE OF CONSULTATION:  DATE OF DISCHARGE:                                 CONSULTATION   DATE OF CONSULT:  07/10/2007.   CHIEF COMPLAINT:  Status post stent placement for right internal carotid  artery intracranial aneurysm performed on June 24, 2007.   HISTORY OF PRESENT ILLNESS:  This is a very pleasant 55 year old female  who was referred to Dr. Corliss Skains through the courtesy of Dr. Sharene Skeans.  The patient has a cerebral angiogram performed by Dr. Marin Roberts on February 23, 2007, that revealed 2 cerebral aneurysms, one  was a 14-mm mostly thrombosed left cavernous carotid artery aneurysm.  The other was a 2-3 mm cavernous cave aneurysm of the right internal  carotid artery.  On February 27, 2007, the patient underwent stent-  assisted coiling of the larger of the two aneurysms.  She was readmitted  to [facility] June 24, 2007, for treatment of the remaining  right internal carotid artery aneurysm.  During that visit, she had a  stent placed.  The aneurysm was not coiled at that time as it was felt  that the stent might move during the coiling process, a staged coiling  procedure may be required in the future.  The patient returns today to  be seen in followup.   PAST MEDICAL HISTORY:  Significant for hypertension, anemia,  diverticular disease, and the above-noted aneurysms.   PAST SURGICAL HISTORY:  The patient is status post hysterectomy, status  post bilateral tubal ligation.   ALLERGIES:  The patient is allergic to SULFA.   MEDICATIONS:  Include aspirin and Plavix,  Plavix was started in  anticipation of the stent placement.  She is also on Lotensin 10 mg  daily, multivitamin daily, and ibuprofen p.r.n.   SOCIAL HISTORY:  The patient is separated.  She has 3 children.  She is  currently unemployed.  She does not use alcohol or tobacco.   FAMILY HISTORY:  Her mother died from heart disease at age 39.  Her  father died from complications of diabetes at age 59.  There is no known  family history of cerebral aneurysms.   IMPRESSION AND PLAN:  As noted, this patient returns today to be seen in  followup after undergoing placement of a right internal carotid artery  stent for a staged treatment of the 2.5 x 2.1 mm aneurysm.  Unfortunately, Dr. Corliss Skains was not available as she was in an  emergency procedure.  The patient agreed to be seen by me in Dr.  Fatima Sanger absence.   She reports that she has been doing well following her procedure.  Her  procedure was complicated by some bleeding from her groin on the day of  discharge.  A decision was made to keep her in the hospital on extra day  just to further monitor her groin for bleeding.  The patient reports  that she has had no further difficulties with her groin.  She denies any  pain or hematoma at the site.   The patient also denies any headaches or dizziness.  She states that she  feels fine and overall she felt that she had a good result from the  procedure.   The patient was given permission to resume her previous activities,  including driving.  It is recommended that she remain on her aspirin and  Plavix until her next angiogram, which will be scheduled in  approximately 3 months.  The patient was told to call in the interim if  she had any questions or any new neurologic symptoms.  The patient was  given a new prescription for Plavix.  Greater than 10 minutes was spent  on this followup visit.      Delton See, P.A.    ______________________________  Grandville Silos. Corliss Skains, M.D.    DR/MEDQ  D:  07/10/2007  T:  07/11/2007  Job:  [id number]   cc:   Valetta Mole. Swords, MD  Pramod P. Pearlean Brownie, MD  [name], M.D.

## 2010-05-17 NOTE — Consult Note (Signed)
Krystal Houston, Krystal Houston               ACCOUNT NO.:  [id number]   MEDICAL RECORD NO.:  [id number]          PATIENT TYPE:  OUT   LOCATION:  XRAY                         FACILITY:  [facility]   PHYSICIAN:  Sanjeev K. Deveshwar, M.D.DATE OF BIRTH:  [birth year]   DATE OF CONSULTATION:  02/28/2008  DATE OF DISCHARGE:                                 CONSULTATION   CHIEF COMPLAINT:  Status post second stent placed on January 22, 2008  for a right internal carotid artery aneurysm.   BRIEF HISTORY:  This is a pleasant 55 year old female who was referred  to Dr. Corliss Skains through the courtesy of Dr. Sharene Skeans.  The patient had  a cerebral angiogram performed by Dr. Marin Roberts on February 23, 2007 that showed two cerebral aneurysms.  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   On February 29, 2009, the patient underwent stent-assisted coiling of  the larger of the two aneurysms performed by Dr. Corliss Skains.  She was  later re-admitted to [facility] on June 24, 2007 for treatment  of the remaining right internal carotid artery.  At that time, a stent  was placed.  It was felt that coiling would be too risky and that this  could possibly be performed at a later date.   The patient had a followup cerebral angiogram on September 16, 2007 that  showed near complete obliteration of the giant left internal carotid  artery aneurysm which had previously been treated with stent-assisted  coiling.  There was an interval decrease in the neck remnant along the  inferior medial aspect of the giant aneurysm.  The other aneurysm in the  right internal carotid artery measured 2.5 x 2.1 mm and had a patent  stent at the base.  It was felt that the aneurysm was essentially  unchanged and further intervention was recommended.  The patient was  admitted to [facility] on January 31, 2008.  On that day, she  did undergo placement of a second stent in the right internal carotid  artery.  Coiling was attempted, however, it was not successful.  The  second stent was a telescoping Enterprise stent.   Following the procedure, the patient did well except for some mild  dizziness with ambulation.  Following the procedure, she was kept in the  hospital an additional day and discharged on January 24, 2008.  She  returns today to be seen in followup.  She missed two previous  appointments.   PAST MEDICAL HISTORY:  Significant for hypertension.  The patient has  been somewhat noncompliant with her hypertensive medications.  She also  has a history of anemia and diverticular disease.   PAST SURGICAL HISTORY:  Significant for a hysterectomy and bilateral  tubal ligation.  She denies any previous problems with anesthesia.   ALLERGIES:  She is allergic to SULFA.   MEDICATIONS:  1. Lisinopril.  2. Aspirin 325 mg daily.   She did complete a 4-week course of  Plavix after the placement of the  stent.   SOCIAL HISTORY:  The patient is separated.  She has 3 children.  She is  currently unemployed.  She does not use alcohol or tobacco.   FAMILY HISTORY:  Her mother died from heart disease at age 14.  Her  father died from complications of diabetes at age 6.  There is no known  family history of cerebral aneurysms.   IMPRESSION:  As noted, the patient returns today to be seen in followup  by Dr. Corliss Skains after the placement of a second stent for a right  internal carotid artery aneurysm with unsuccessful attempts at coiling  of the aneurysm.  The patient states that she has been doing well other  than occasional dizziness, occasional double vision, generalized  weakness, and some problems with her balance.  She has not followed up  with any of her other physicians to this point.  It is somewhat unclear  if she is actually taking her blood pressure medications at this  time.   Dr. Corliss Skains did review the results of the stent placement.  He showed  her before and after images from the angiogram.  He pointed out the  stent.  He felt that at this time the aneurysm was adequately treated,  although he did recommend a followup angiogram at approximately 3-4  months.   We are somewhat concerned about the ongoing dizziness and diplopia.  Dr.  Corliss Skains did not feel that it was related to the aneurysms.  He did,  however, recommend that she followup with her primary care physician,  Dr. Cato Mulligan for further monitoring of her blood pressure and have a CBC  to evaluate for anemia.  We did stress to the patient how important it  was that her blood pressure be maintained within a reasonable range.   All of the patient's questions were answered.  She is to continue on the  aspirin as well as her lisinopril and once again we plan on repeating a  followup cerebral angiogram in 3-4 months to further evaluate her  aneurysms.   TIME SPENT:  Greater than 15 minutes were spent on this visit.      Delton See, P.A.    ______________________________  Grandville Silos. Corliss Skains, M.D.    DR/MEDQ  D:  02/28/2008  T:  02/29/2008  Job:  [id number]   cc:   [name], M.D.  Bruce Rexene Edison Swords, MD

## 2010-05-17 NOTE — Consult Note (Signed)
Krystal, Houston NO.:  [id number]   MEDICAL RECORD NO.:  [id number]          PATIENT TYPE:  OUT   LOCATION:  XRAY                         FACILITY:  [facility]   PHYSICIAN:  Delton See, P.A.   DATE OF BIRTH:  December 20, 1955   DATE OF CONSULTATION:  04/10/2007  DATE OF DISCHARGE:                                 CONSULTATION   CHIEF COMPLAINT:  Status post stent assisted coiling of a left internal  carotid artery aneurysm, which was partially thrombosed performed  February 27, 2007.   HISTORY OF PRESENT ILLNESS:  This is a very pleasant 55 year old female  who was admitted to [facility] in February 2009 for evaluation  of a headache.  She was seen in consultation by Dr. Sharene Skeans.  A CT scan  of the brain was performed that suggested a left carotid cavernous  aneurysm.  An MRI, MRA was also performed which confirmed the diagnosis.  A cerebral angiogram was performed by Dr. Marin Roberts and on  February 21, that showed a 14-mm mostly thrombosed left cavernous  carotid artery aneurysm, that involved the long segment of the left  internal carotid artery to 10-mm.  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   On February 27, 2007, the patient underwent stent assisted coiling of  the left internal carotid artery aneurysm performed by Dr. Corliss Skains  with Dr. Alfredo Batty assisting.  The patient tolerated the procedure well.  She was seen in followup on March 13, 2007 by Dr. Corliss Skains.  At that  time, she was having some problems with headaches and pain behind her  left eye.  She also reported balance problems, neck pain, occasional  blurred vision, and photophobia of the left eye.  Dr. Corliss Skains  recommended scheduled ibuprofen to see if this would help with her  symptoms.  The patient returns today to be seen in followup.   PAST MEDICAL HISTORY:  Significant for hypertension, history of  anemia,  history of diverticular disease, and the above noted aneurysms.   SURGICAL HISTORY:  The patient is status post hysterectomy.   ALLERGIES:  She is allergic to sulfa.   SOCIAL HISTORY:  The patient is separated.  She has 3 children.  She is  currently not employed.  She does not use alcohol or tobacco.   FAMILY HISTORY:  Her mother died from heart disease at age 40.  Her  father died from complications of diabetes at age 64.  There is no known  family history of aneurysms.   CURRENT MEDICATIONS:  The patient is now taking a full strength aspirin  325 mg once a day.  She is now off her Plavix.  She is also taking  ibuprofen on a scheduled basis.  She reports no gastric irritation from  the ibuprofen.   IMPRESSION AND PLAN:  As noted, the patient return today to be seen in  followup regarding headaches.  She had experienced after undergoing a  stent assisted coiling of left internal carotid artery on February 27, 2007.  The patient reports that her headaches are much improved with the  scheduled ibuprofen.  Once again, she reports no gastrointestinal side  effects from the ibuprofen at this time.  She has left ptosis of the  left eye.  Her vision has improved and she has less pain in the left  eye.  She feels that her symptoms are very tolerable at this time.   As noted, the patient had a residual right carotid aneurysm which  measured 2-3 mm.  The patient is interested in having this aneurysm  treated as well.  The plan was to schedule her for a followup cerebral  angiogram.  However, Dr. Corliss Skains felt that we could schedule her for  the angiogram with anesthesia on standby for a possible stent-assisted  coiling of the residual aneurysm.  The patient was given a prescription  for Plavix today, which will be started 3 days prior to her procedure.  We will schedule her for the end of May.  Greater than 10 minutes was  spent on this consult.      Delton See,  P.A.     DR/MEDQ  D:  04/10/2007  T:  04/11/2007  Job:  [id number]   cc:   [name], M.D.  [name], M.D.

## 2010-05-19 ENCOUNTER — Other Ambulatory Visit: Payer: Self-pay | Admitting: Internal Medicine

## 2010-05-19 DIAGNOSIS — I10 Essential (primary) hypertension: Secondary | ICD-10-CM

## 2010-05-19 MED ORDER — LISINOPRIL 10 MG PO TABS: 10.0000 mg | ORAL_TABLET | Freq: Every day | ORAL | Status: DC

## 2010-05-19 NOTE — Telephone Encounter (Signed)
Pt needs OV but called in 30 day supply

## 2010-05-19 NOTE — Telephone Encounter (Signed)
Pt needs lisinopril 10mg  call into [facility] [phone]

## 2010-05-20 NOTE — H&P (Signed)
NAME:  Krystal Houston, Krystal Houston NO.:  [id number]   MEDICAL RECORD NO.:  [id number]           PATIENT TYPE:   LOCATION:                                 FACILITY:   PHYSICIAN:  Osborn Coho, M.D.   DATE OF BIRTH:  1955-09-10   DATE OF ADMISSION:  DATE OF DISCHARGE:                                HISTORY & PHYSICAL   HISTORY OF PRESENT ILLNESS:  Ms. Mauck is a 55 year old African-American  female para 3-0-0-3 who is status post bilateral tubal ligation who presents  for a total abdominal hysterectomy with placement of tension-free vaginal  tape because of symptomatic uterine fibroids, menorrhagia, dysmenorrhea, and  symptoms of stress urinary incontinence.  For the past three years patient  has noticed a significant increase in her menstrual flow which has in the  past three months worsened considerably.  Patient typically would experience  a five-day menstrual flow which was described as heavy but most recently she  has had this extent to four weeks.  Her flow is characterized as being very  heavy with clots requiring the use of two sanitary pads which she changes  every three hours.  In spite of this patient has also noticed some soiling  of her clothing on occasion.  Additionally, patient complains of what she  describes as 9/10 menstrual cramps which decrease to 3/10 on a 10-point pain  scale with the use of ibuprofen 400 mg.  She denies any fever, nausea,  vomiting, diarrhea, or vaginitis symptoms but she does admit to dyspareunia.  A pelvic ultrasound in March of 2007 revealed three measurable fibroids, the  largest of which measured 3.1 x 2.5 x 2.8 cm.  She also was observed to have  a fibroid which possibly had a submucosal component to it.  Remainder of her  ultrasound was within normal limits.  An endometrial biopsy done at that  same time returned normal without any evidence of hyperplasia or malignancy.  Additionally, patient over the past few years has noticed  that with coughing  and sneezing she will leak urine.  Additionally, there are occasions in  which she has such intense urgency that she can hardly make it to the  bathroom on time.  She denies any dysuria, any hematuria, or flank pain with  these symptoms.  Patient was given medical and surgical options for managing  her complaints; however, due to potential side effects of medication she  wishes to proceed with surgical management.   PAST MEDICAL HISTORY:   OB HISTORY:  Gravida 3, para 3-0-0-3.  Patient had three spontaneous vaginal  births in 1978, 1980, and [birth year].   GYN HISTORY:  Menarche 55 years old.  Last menstrual period was April 07, 2005.  She uses tubal ligation as her method of contraception.  Denies any  history of sexually transmitted diseases or abnormal Pap smears.  Her last  normal Pap smear was March 2007.  The patient had an abnormal mammogram in  April of 3002 due to a question regarding density in her right upper  quadrant of her right breast.  However,  a follow-up ultrasound returned  normal.   MEDICAL HISTORY:  Positive for anemia and diverticulosis.   SURGICAL HISTORY:  Bilateral tubal ligation in 1983.  Patient denies any  problems with anesthesia or history of blood transfusion.   FAMILY HISTORY:  Positive for hypertension and diabetes.   HABITS:  Patient does not use alcohol, tobacco, or illicit drugs.   SOCIAL HISTORY:  Patient is separated and she is currently unemployed.   CURRENT MEDICATIONS:  1.  Multivitamins one tablet daily.  2.  Ferrous sulfate one tablet daily.  3.  Ibuprofen 400 mg as needed.  4.  Smart Burn three times daily (last taken June 02, 2005).   REVIEW OF SYSTEMS:  Patient does have occasional constipation.  Denies any  history of chest pain, shortness of breath, dysuria, flank pain, hematuria  and except as mentioned in history of present illness patient's review of  systems is negative.   PHYSICAL EXAMINATION:  VITAL SIGNS:   (March 23, 2005)  Blood pressure  118/82, weight 144 pounds, height 5 feet 1 inch tall.  NECK:  Supple without masses.  There is no thyromegaly or cervical  adenopathy.  HEART:  Regular rate and rhythm.  There is no murmur.  LUNGS:  Clear to auscultation.  There are no wheezes, rales, or rhonchi.  BACK:  No CVA tenderness.  ABDOMEN:  Bowel sounds are present.  It is soft, without tenderness,  guarding, rebound, or organomegaly.  EXTREMITIES:  Without clubbing, cyanosis, edema.  PELVIC:  EGBUS is within normal limits.  Vagina is rugose.  Patient was  observed to have a cystocele.  Cervix was nontender without lesions.  Uterus  was enlarged and bulky without tenderness.  Adnexa without tenderness or  masses.  Rectovaginal:  Patient was observed to have a rectocele.   A CBC May 12, 2005 showed white blood count 3.9, red blood count 5.14,  hemoglobin 12.9, hematocrit 40, platelet count 315.   IMPRESSION:  1.  Symptomatic uterine fibroids.  2.  Menorrhagia.  3.  Dysmenorrhea.  4.  Stress urinary incontinence with hypermobile urethra.   DISPOSITION:  A discussion was held with patient regarding the indication  for her procedures along with their risks which include, but are not limited  to, reaction to anesthesia, damage to adjacent organs, infection, excessive  bleeding, the possibility of erosion of the tension-free vaginal tape, and  the possibility of worsening of her urinary symptoms.  Patient has agreed  and consented to proceed with a total abdominal hysterectomy with bilateral  salpingo-oophorectomy and placement of tension-free vaginal tape at [facility] June 06, 2005 at 10:00 a.m.      [place]mira J. Adline Peals.      Osborn Coho, M.D.  Electronically Signed    EJP/MEDQ  D:  06/05/2005  T:  06/05/2005  Job:  [id number]

## 2010-05-20 NOTE — Discharge Summary (Signed)
NAMECLIO, Krystal Houston               ACCOUNT NO.:  [id number]   MEDICAL RECORD NO.:  [id number]          PATIENT TYPE:  INP   LOCATION:  9313                          FACILITY:  [facility]   PHYSICIAN:  Osborn Coho, M.D.   DATE OF BIRTH:  [birth year]   DATE OF ADMISSION:  06/06/2005  DATE OF DISCHARGE:  06/09/2005                                 DISCHARGE SUMMARY   DISCHARGE DIAGNOSES:  1. Symptomatic uterine fibroids.  2. Adenomyosis.  3. Menorrhagia.  4. Dysmenorrhea.  5. Pelvic adhesions.  6. Stress urinary incontinence.   OPERATION:  On the date of admission the patient underwent a total abdominal  hysterectomy with bilateral salpingo-oophorectomy and placement of tension-  free vaginal tape followed by cystoscopy tolerating all procedures well.  The patient was found to have a uterus weighing approximately 400 grams with  normal-appearing tubes and ovaries bilaterally.   HISTORY OF PRESENT ILLNESS:  Krystal Houston is a 55 year old African American  female para 3-0-0-3 who is status post bilateral tubal ligation presenting  for an abdominal hysterectomy with the placement of tension-free vaginal  tape because of symptomatic uterine fibroids, menorrhagia, dysmenorrhea and  symptoms of stress urinary incontinence.  Please see the patient's dictated  history and physical examination for details.   PREOPERATIVE PHYSICAL EXAMINATION:  VITAL SIGNS:  Blood pressure 118/82,  weight 144 pounds, height 5 feet 1 inch tall.  GENERAL EXAM:  Within normal limits.  PELVIC EXAM:  EGBUS is within normal limits.  Vagina is rugose though a  cystocele was observed.  Cervix was without tenderness or lesions.  Uterus  was enlarged and bulky without tenderness.  Adnexa without tenderness or  masses.  Rectovaginal was without masses though the patient was observed to  have a rectocele.   HOSPITAL COURSE:  On the date of admission the patient underwent  aforementioned procedures tolerating them all well.   Postoperative course  was marred by the patient having slow return of bowel function.  However,  she tolerated a postop hemoglobin of 10.0 without difficulty.  The patient's  preoperative hemoglobin was 12.4.  By postop day #3 the patient had resumed  bowel and bladder function and was therefore deemed ready for discharge  home.   DISCHARGE MEDICATIONS:  1. Vicodin one to two tablets every 4-6 hours as needed for breakthrough      pain.  2. Ibuprofen 600 mg every 6 hours with food for 3 days then as needed for      pain.  3. Ferrous sulfate 325 mg one tablet daily for 6 weeks.  4. Colace 100 mg twice daily until bowel movements are regular.   FOLLOW UP:  The patient is scheduled for a 6 weeks postoperative visit with  Dr. Su Hilt on July 18, 2005 at 2 p.m.   DISCHARGE INSTRUCTIONS:  The patient was given a copy of [facility] postoperative instruction sheet.  She was further advised to avoid  driving for 2 weeks, heavy lifting for 4 weeks, intercourse for 6 weeks,  that she may shower, may walk up steps.  She should increase her  activities  slowly and that she should keep her incision clean and dry.  The patient's  diet was without restriction.   FINAL PATHOLOGY:  Uterus, cervix, right and left ovaries and fallopian  tubes:  Cervix - chronic cervicitis with squamous metaplasia, no  intraepithelial lesion identified; endometrium - secretory endometrium;  myometrium - adenomyosis, multiple leiomyomas; smaller ovary with fallopian  tube - benign ovary with surface adhesions.  Benign fallopian tube.  Large  ovary with fallopian tube - ovary with hemorrhagic corpus luteum and surface  adhesions, fallopian tube with hydrosalpinx.      Krystal Houston.      Osborn Coho, M.D.  Electronically Signed    EJP/MEDQ  D:  08/09/2005  T:  08/09/2005  Job:  [id number]

## 2010-05-20 NOTE — Discharge Summary (Signed)
Krystal Houston, Krystal Houston               ACCOUNT NO.:  [id number]   MEDICAL RECORD NO.:  [id number]          PATIENT TYPE:  INP   LOCATION:  3107                         FACILITY:  [facility]   PHYSICIAN:  Pramod P. Pearlean Brownie, MD    DATE OF BIRTH:  1955-06-02   DATE OF ADMISSION:  02/22/2007  DATE OF DISCHARGE:  02/28/2007                               DISCHARGE SUMMARY   DIAGNOSES AT TIME OF DISCHARGE:  1. Left cavernous carotid aneurysm, status post cholecystectomy by Dr.      Kerby Nora with no neuro symptoms.  2. Headache.   MEDICINES AT THE TIME OF DISCHARGE:  1. Plavix 75 mg a day.  2. Aspirin 81 mg a day.  3. Pepcid over-the-counter twice a day while on Decadron.  4. Decadron as instructed by Dr. Corliss Skains.  5. Hydrochlorothiazide as instructed by Dr. Corliss Skains.   STUDIES PERFORMED:  1. EKG shows normal sinus rhythm with nonspecific T-wave abnormality.  2. Cerebral angiogram shows a 14-mm mostly thrombus left cavernous      carotid artery aneurysm.  This involves long segment of the left      internal carotid artery to 10 mm and a 2-3 mm cavernous inferior of      hypophyseal aneurysm on the right internal carotid artery.  3. Chest x-ray shows no active lung disease.  4. CT of the brain done in [facility] [place] shows a possible left carotid      cavernous aneurysm.  5. MRI of the brain without and with contrast shows left cavernous      carotid aneurysm as her MRA of the brain per Dr. Darl Householder  note.  6. Formal MRA report not in discharge chart.   LABORATORY STUDIES:  CBC with hemoglobin on admission 13.1, down to  10.7, otherwise normal.  Coagulation studies on admission with PTT 57,  INR 1.1, PT 13.9.  Chemistry with glucose 113, otherwise normal.  Cholesterol 185, triglycerides 95, HDL 51, LDL 115.   HISTORY OF PRESENT ILLNESS:  [name] is a 55 year old right-  handed African American female with a 3-day history of left retro-  orbital pain referring to the temporal  parietal and mastoid region and  also the left neck when she turns it.  The patient was seen on February 21, 2007, at [facility].  She was diagnosed with migraines despite the  fact that these symptoms are new and she has never experienced a  migraine.  She was treated with nabumetone 500 mg b.i.d. which provided  some relief.  Her pain waxes and wanes.  She has a pressure-like pain  which is pounding.  She denies nausea or vomiting, especially to lighter  sound or movement.  The patient had complained of some blurred vision in  the left eye and has little black spots in her left eye that were  present earlier in the week, but are going at this time.  These acted  like floaters and have not persisted.   The patient was seen at [facility] in the morning of  admission.  CT scan of the brain  suggested left cavernous carotid  aneurysm.  MRI of the brain with and without contrast and MRA confirmed  the diagnosis.  There appeared to be some clots and aneurysmal  dilatation with the last cavernous sinus.  The distal left carotid  emerges from the cavernous sinus in normal size and the proximal carotid  is also normal size in the  cavernous sinus.  The lesion is somewhat  global and somewhat irregular and suggest that there may be clot within  it.  The patient has severe right maxillary sinusitis and the maxillary  sinus is completely filled with fluid.   She was transferred to [facility] for cerebral angiogram.  Stent was considered favorable.   HOSPITAL COURSE:  A 4-vessel cerebral angiogram was performed by Dr.  Alfredo Batty, showing a left cavernous carotid aneurysm at 2 mm right  cavernous  cave and a bovine arch with take off of the left common  carotid artery.  The patient was started on low-dose heparin and case  discussed with Dr. Corliss Skains.  Endovascular treatment was discussed with  the patient.  She was offered a second opinion at [facility] but  she  prefers to have  procedure done during this hospitalization.  Dr.  Corliss Skains saw the patient and she agreed to intervention.  This was  found on June 27, 2006.  Procedure was done today by Dr. Kerby Nora  and he embolized with stent assistance and biologic goals, a 15 mm x 5.5  left paracavernous aneurysm.  She had no acute changes postop and was  extubated without difficulty.  Following day, she was stable for  discharge.   RECOMMENDATIONS:  Recommendations are for the repeat angio in 3-6 months  and follow up with Dr. Pearlean Brownie in 2-3 months.   CONDITION ON DISCHARGE:  The patient is normocephalic and atraumatic.  Extraocular movements are intact.  Face is symmetric.  Visual fields are  intact.  Heart rate is regular.  Breath sounds are clear.  Abdomen is  soft.  Has good strength in extremities.  Sensation is normal to light  touch.   DISCHARGE/PLAN:  1. The patient was discharged by Dr. Kerby Nora.  She has been      asked to follow up with Dr. Corliss Skains in 2 weeks.  On Wednesday,      March 13, 2007, at 3:00 p.m.  she will continue aspirin and Plavix      status post choline.  2. Monitor headaches.  3. Watch temperature.  4. Pepcid while on Decadron.  5. No driving for 2 weeks.  No heavy lifting or strenuous exercise for      2 weeks  6. No bending, stooping, or extreme movements of the neck from side to      side for 2 weeks and may return to work in 2 weeks.  7.  Plans for      a cerebral angiogram in 3 months.  Follow up Dr. Pearlean Brownie in 2-3      months.  7. Follow up primary care physician for blood pressure control as she      was placed on hydrochlorothiazide this admission.      [name], [name]    ______________________________  Sunny Schlein. Pearlean Brownie, MD    SB/MEDQ  D:  07/12/2007  T:  07/13/2007  Job:  [id number]   cc:   Darcella Cheshire, MD  Alinda Money MD Deveshwar

## 2010-05-20 NOTE — Op Note (Signed)
NAMESAMONA, Krystal Houston               ACCOUNT NO.:  [id number]   MEDICAL RECORD NO.:  [id number]          PATIENT TYPE:  INP   LOCATION:  9313                          FACILITY:  [facility]   PHYSICIAN:  Osborn Coho, M.D.   DATE OF BIRTH:  1955-04-14   DATE OF PROCEDURE:  06/06/2005  DATE OF DISCHARGE:                                 OPERATIVE REPORT   PREOP DIAGNOSIS:  1.  Symptomatic fibroids.  2.  Menorrhagia  3.  Dysmenorrhea.  4.  Stress urinary incontinence with hypermobile urethra.   POSTOPERATIVE DIAGNOSIS:  1.  Symptomatic fibroids.  2.  Menorrhagia  3.  Dysmenorrhea.  4.  Stress urinary incontinence with hypermobile urethra.  5.  Pelvic adhesions.   PROCEDURES:  1.  Total abdominal hysterectomy.  2.  Bilateral salpingo-oophorectomy.  3.  TVT.  4.  Cystoscopy.  5.  Lysis of adhesions.   ATTENDING DOCTOR:  Osborn Coho, M.D.   ASSISTANT:  Marquis Lunch. Adline Peals.   ANESTHESIA:  General.   SPECIMENS TO PATHOLOGY:  Uterus, cervix, bilateral ovaries, and fallopian  tubes.  Uterus and cervix weight 400 grams   ESTIMATED BLOOD LOSS:  300 mL.   FLUIDS:  3000 mL.   URINE OUTPUT:  400 mL.   COMPLICATIONS:  None.   FINDINGS:  Fibroid uterus with multiple fibroids, normal-appearing bilateral  ovaries and tubes.   DESCRIPTION OF PROCEDURE:  The patient was taken to the operating room after  the risks, benefits, and alternatives reviewed with the patient.  The  patient verbalized understanding and consent signed and witnessed.  The  patient was placed under general, per anesthesia, and prepped and draped in  a normal sterile fashion.  A Pfannenstiel skin incision was made and carried  down to the underlying layer of fascia with the Bovie.  The fascia was  excised bilaterally in the midline and extended bilaterally with the Mayo  scissors.  Kocher clamps were placed on the inferior aspect of the fascial  incision and the rectus muscle excised from the fascia.  The same  was done  on the superior aspect of fascial incision.  The muscles were separated in  the midline and the peritoneum entered bluntly.  The peritoneum was extended  manually and sharply with the Metzenbaum scissors.   The uterus was elevated and the bowel packed away with 2 moist lap pads.  Two elevated Kelly's were replaced at the cornu of the uterus for elevation  and O'Connor-O'Sullivan's placed for self-retraction.  The round ligament on  the left was clamped with a Kelly and suture ligated using #0 Vicryl via a  figure-of-eight stitch.  Another #0 Vicryl stitch was placed more proximal  to the uterus.  The round ligament was cauterized with the Bovie; and the  bladder flap created and the posterior leaf of the broad ligament excised as  well for another approximately 1 cm.  The utero-ovarian pedicle was  identified and a passage away bluntly made; and 2 large Kelly clamps placed.  The utero-ovarian was cut and a free tie placed of #0 Vicryl on the ovarian  side and a suture ligated with #0 Vicryl as well.  The uterine side was  suture ligated with #0 Vicryl as well.  A Tanja Port was used to elevate the  ovary and the ureter identified and noted to be away from the ovary; and  noted to peristalsis normally.  Beneath the ovary was clamped with a large  Kelly and the ovary and tube excised and sent to pathology.  The  infundibulopelvic ligament was then tied with a #0 Vicryl tie and suture  ligated with a #0 Vicryl.  The right round ligament was identified and  clamped with a Kelly in a similar fashion and ligated with #0 Vicryl  cauterized with the Bovie and bladder flap created.   The ureter was identified on that side and noted to peristalsis and be away  from the ovary.  A passage-way was created and the infundibulopelvic was  clamped with 2 large Kelly's;  tied with #0 Vicryl and suture ligated with  #0 Vicryl as well. The ovarian side was tied with #0 Vicryl tie.  The  bladder was  pushed away; and the uterine vessels were bilaterally  skeletonized and the curved Heaney's were placed on the uterine vessels  bilaterally which were clamped, cut, and suture ligated using #0 Vicryl.  The paracervical tissue was entered in a sequential and bilateral fashion,  clamped with straight Heaney clamps, cut, and suture ligated using #0 Vicryl  down to the level of the external os, and incorporating the cardinal and  uterosacral ligaments.  The angles were suture ligated using a fixation  stitch of #0 Vicryl.  The cuff was repaired with #0 Vicryl via figure-of-  eight stitches; and good hemostasis was noted.  The fixation stitches were  tied together and copious irrigation had been performed and good hemostasis  noted.  The ureters were identified, again, and noted to peristalsis  normally bilaterally.  There was good hemostasis at all pedicle sites  including the infundibulopelvic's bilaterally.  All sutures were cut and all  instruments removed.  The peritoneum was repaired with #0 Vicryl in a  running fashion and the muscle reapproximated using a figure-of-eight stitch  x1.  The fascia was repaired using #0 Vicryl in a running fashion.   The subcutaneous tissue was irrigated; and made hemostatic with the Bovie.  A #0 plain was used in 3 interrupted stitches to reapproximate the  subcutaneous tissue.  The skin was reapproximated using a subcuticular  stitch of 3-0 Monocryl.  Prior to closing the fascia.  The muscle was  irrigated and any areas of oozing were made hemostatic with the Bovie.  The  patient was then placed in  the lithotomy position, where she had been  placed in, at the beginning of the case; however, the legs were now  elevated.  Attention was turned to the suprapubic region where at the  symphysis pubis, 2 incisions were made 2 fingerbreadths from the midline  bilaterally, at the symphysis pubis.  A total of 5 mL of 1/4% Marcaine was  used.  Attention was then  turned to the anterior vaginal wall after a weighted  speculum placed; and the vaginal wall injected with dilute Pitressin with 20  units of Pitressin and 100 mL of normal saline.  Two Allis's were replaced  incorporating approximately 1 cm of the vaginal mucosa underlying the  midline of the urethra.  An incision was made and the underlying tissue was  dissected away to the level of the lower  portion of the symphysis pubis.  The transabdominal guides were passed after the bladder was emptied  completely and a rigid ears urethral catheter guide was retracted to the  left; and a transabdominal guide passed ipsilaterally.  The same was done on  the contralateral side.  Cystoscopy was then performed and no inadvertent  bladder injury was noted.  Rigid ureteral catheter guide was then placed  after removing cystoscope and indigo carmine administered.   The mesh was attached to the transabdominal guide below and elevated through  the incisions at the level of the symphysis pubis.  Cystoscopy was then  performed, once again, and bilateral ureters were noted to efflux and no  inadvertent bladder injury was noted.  A large Tresa Endo was placed between the  mesh and the urethra; and was left loose with approximately 5 mm between the  two; and the sheath removed from the mesh bilaterally.  The mesh was cut at  the level just beneath the level of the skin at the 2 symphysis pubis  incisions and the incisions repaired with Dermabond.  The vaginal wall  anteriorly was repaired with 2-0 Vicryl via a running lock stitch.  The  vagina was packed with plain packing, soaked in Estrace cream.  Good  hemostasis was noted.  Sponge, lap, and needle counts were correct.  The  patient tolerated the procedure well and was returned to the recovery room  in good condition.      Osborn Coho, M.D.  Electronically Signed     AR/MEDQ  D:  06/06/2005  T:  06/07/2005  Job:  [id number]

## 2010-05-20 NOTE — Op Note (Signed)
Krystal Houston, Krystal Houston               ACCOUNT NO.:  [id number]   MEDICAL RECORD NO.:  [id number]          PATIENT TYPE:  AMB   LOCATION:  ENDO                         FACILITY:  [facility]   PHYSICIAN:  Anselmo Rod, M.D.  DATE OF BIRTH:  1955-03-23   DATE OF PROCEDURE:  04/24/2005  DATE OF DISCHARGE:                                 OPERATIVE REPORT   PROCEDURE PERFORMED:  Screening colonoscopy.   ENDOSCOPIST:  Anselmo Rod, M.D.   INSTRUMENT USED:  Olympus videoscopic colonoscope.   INDICATIONS FOR PROCEDURE:  A 55 year old female underwent a screening  colonoscopy to rule out colonic polyps, masses, etc.  Patient has a history  of mild iron-deficiency anemia and a family history of colon cancer in two  maternal aunts and a maternal uncle.   PRE-PROCEDURE PREPARATION:  Informed consent was procured from the patient.  The patient fasted for four hours prior to the procedure and prepped with  OsmoPrep pills the night and in the morning of the procedure.  Risks and  benefits of the procedure, including a 10% missed rate of cancer and polyps  were discussed with the patient as well.   PRE-PROCEDURE PHYSICAL EXAMINATION:  VITAL SIGNS:  Stable vital signs.  NECK:  Supple.  CHEST:  Clear to auscultation.  HEART:  S1 and S2.  Regular.  ABDOMEN:  Soft with normal bowel sounds.   DESCRIPTION OF PROCEDURE:  The patient was placed in the left lateral  decubitus position and sedated with 100 mcg of fentanyl and 10 mg of Versed  in slow, incremental doses.  Once the patient was adequately sedated,  maintained on low flow oxygen with continuous cardiac monitoring, the  Olympus videoscopic colonoscope was advanced from the rectum to the cecum.  There was evidence of pan diverticulosis.  There were a few small, scattered  diverticula seen throughout the colon.  A small erythematous polyp was  biopsied from the proximal right colon.  The terminal ileum appeared normal.  There was questionable  AVM in the cecum, but I think this resembles more of  a small area of trauma rather than AVM because of definite features of AVM  with branching blood vessels was not noted.   IMPRESSION:  1.  Early pan diverticular disease.  2.  Small raised, erythematous polyp, biopsied from the proximal right      colon.  3.  Normal terminal ileum.   RECOMMENDATIONS:  1.  Await pathology results.  2.  Avoid all nonsteroidals anti-inflammatories and aspirin for the next two      weeks.  3.  Outpatient followup in the next two weeks for further recommendations      and evaluation of her anemia.      Anselmo Rod, M.D.  Electronically Signed     JNM/MEDQ  D:  04/25/2005  T:  04/25/2005  Job:  [id number]   cc:   Osborn Coho, M.D.  Fax: [phone]

## 2010-07-05 ENCOUNTER — Other Ambulatory Visit: Payer: Self-pay | Admitting: *Deleted

## 2010-08-16 ENCOUNTER — Other Ambulatory Visit: Payer: Self-pay | Admitting: Internal Medicine

## 2010-08-16 DIAGNOSIS — I10 Essential (primary) hypertension: Secondary | ICD-10-CM

## 2010-08-16 MED ORDER — LISINOPRIL 10 MG PO TABS: 10.0000 mg | ORAL_TABLET | Freq: Every day | ORAL | Status: DC

## 2010-08-16 NOTE — Telephone Encounter (Signed)
Pt req refill of lisinopril (PRINIVIL,ZESTRIL) 10 MG to [facility] on [address] to last until pt appt in October. Pt completely out of med.

## 2010-08-16 NOTE — Telephone Encounter (Signed)
rx sent in electronically 

## 2010-08-24 ENCOUNTER — Other Ambulatory Visit (HOSPITAL_COMMUNITY): Payer: Self-pay | Admitting: Interventional Radiology

## 2010-08-24 DIAGNOSIS — I729 Aneurysm of unspecified site: Secondary | ICD-10-CM

## 2010-08-26 ENCOUNTER — Other Ambulatory Visit (HOSPITAL_COMMUNITY): Payer: Self-pay | Admitting: Interventional Radiology

## 2010-08-26 ENCOUNTER — Ambulatory Visit (HOSPITAL_COMMUNITY)
Admission: RE | Admit: 2010-08-26 | Discharge: 2010-08-26 | Disposition: A | Discharge status: Home / Self Care | Payer: BC Managed Care – PPO | Source: Ambulatory Visit | Attending: Interventional Radiology | Admitting: Interventional Radiology

## 2010-08-26 DIAGNOSIS — Z09 Encounter for follow-up examination after completed treatment for conditions other than malignant neoplasm: Secondary | ICD-10-CM | POA: Insufficient documentation

## 2010-08-26 DIAGNOSIS — I729 Aneurysm of unspecified site: Secondary | ICD-10-CM

## 2010-08-26 DIAGNOSIS — I671 Cerebral aneurysm, nonruptured: Secondary | ICD-10-CM | POA: Insufficient documentation

## 2010-08-26 LAB — BASIC METABOLIC PANEL
BUN: 11 mg/dL (ref 6–23)
Chloride: 105 mEq/L (ref 96–112)
Creatinine, Ser: 0.65 mg/dL (ref 0.50–1.10)
GFR calc Af Amer: >60 mL/min (ref >60)
GFR calc non Af Amer: >60 mL/min (ref >60)

## 2010-08-26 MED ORDER — GADOBENATE DIMEGLUMINE 529 MG/ML IV SOLN
14.0000 mL | Freq: Once | INTRAVENOUS | Status: AC
  Administered 2010-08-26: 14 mL via INTRAVENOUS

## 2010-09-26 LAB — LIPID PANEL
HDL: 51
Total CHOL/HDL Ratio: 3.6
Triglycerides: 95
VLDL: 19

## 2010-09-26 LAB — PROTIME-INR
INR: 0.9
INR: 1.1
Prothrombin Time: 12.4

## 2010-09-26 LAB — BASIC METABOLIC PANEL
BUN: 16
BUN: 5 — ABNORMAL LOW
BUN: 8
Calcium: 8.2 — ABNORMAL LOW
Chloride: 106
Creatinine, Ser: 0.63
GFR calc Af Amer: >60
GFR calc non Af Amer: >60
GFR calc non Af Amer: >60
Glucose, Bld: 113 — ABNORMAL HIGH
Potassium: 4.1
Sodium: 138

## 2010-09-26 LAB — APTT
aPTT: 134 — ABNORMAL HIGH
aPTT: 175 — ABNORMAL HIGH
aPTT: 57 — ABNORMAL HIGH

## 2010-09-26 LAB — DIFFERENTIAL
Basophils Absolute: 0
Basophils Relative: 1
Eosinophils Absolute: 0
Lymphs Abs: 2.1
Neutrophils Relative %: 47

## 2010-09-26 LAB — CBC
HCT: 39.4
Hemoglobin: 10.7 — ABNORMAL LOW
MCHC: 33.3
MCHC: 33.7
MCV: 78.8
MCV: 79.5
MCV: 80.1
Platelets: 276
Platelets: 285
Platelets: 291
Platelets: 303
RDW: 14.1
RDW: 14.1
RDW: 14.3
RDW: 14.3
WBC: 4.6
WBC: 4.8

## 2010-09-26 LAB — HEPARIN LEVEL (UNFRACTIONATED)
Heparin Unfractionated: 0.4
Heparin Unfractionated: <0.1 — ABNORMAL LOW

## 2010-09-27 LAB — CBC
Platelets: 248
RDW: 13.9

## 2010-09-27 LAB — URINALYSIS, ROUTINE W REFLEX MICROSCOPIC
Bilirubin Urine: NEGATIVE
Ketones, ur: NEGATIVE
Leukocytes, UA: NEGATIVE
Nitrite: NEGATIVE
Protein, ur: NEGATIVE
Urobilinogen, UA: 0.2
pH: 7.5

## 2010-09-27 LAB — DIFFERENTIAL
Basophils Absolute: 0
Lymphocytes Relative: 51 — ABNORMAL HIGH
Neutro Abs: 1.4 — ABNORMAL LOW

## 2010-09-27 LAB — POCT I-STAT, CHEM 8
BUN: 9
Calcium, Ion: 1.2
Chloride: 106
Creatinine, Ser: 0.8
Glucose, Bld: 84
Potassium: 3.6

## 2010-09-29 LAB — DIFFERENTIAL
Basophils Relative: 1
Eosinophils Absolute: 0
Eosinophils Relative: 1
Lymphs Abs: 1.8
Monocytes Relative: 9
Neutrophils Relative %: 34 — ABNORMAL LOW

## 2010-09-29 LAB — BASIC METABOLIC PANEL
BUN: 9
CO2: 24
CO2: 25
Chloride: 107
Chloride: 111
Creatinine, Ser: 0.77
Creatinine, Ser: 0.8
GFR calc Af Amer: >60
Potassium: 3.6
Potassium: 4.3

## 2010-09-29 LAB — CBC
HCT: 31.5 — ABNORMAL LOW
HCT: 33.2 — ABNORMAL LOW
HCT: 39.3
Hemoglobin: 11.5 — ABNORMAL LOW
MCHC: 33.4
MCHC: 33.9
MCHC: 34.6
MCV: 79.9
MCV: 80.3
MCV: 80.6
Platelets: 252
RBC: 3.91
RBC: 4.15
RDW: 13.5
WBC: 3.3 — ABNORMAL LOW
WBC: 4

## 2010-09-29 LAB — URINALYSIS, DIPSTICK ONLY
Leukocytes, UA: NEGATIVE
Nitrite: NEGATIVE
Specific Gravity, Urine: 1.013
pH: 7

## 2010-09-29 LAB — PROTIME-INR
INR: 0.9
Prothrombin Time: 12.1

## 2010-09-29 LAB — HEPARIN LEVEL (UNFRACTIONATED): Heparin Unfractionated: <0.1 — ABNORMAL LOW

## 2010-09-29 LAB — APTT: aPTT: 104 — ABNORMAL HIGH

## 2010-10-03 ENCOUNTER — Encounter: Payer: Self-pay | Admitting: Internal Medicine

## 2010-10-04 ENCOUNTER — Ambulatory Visit: Payer: Self-pay | Admitting: Internal Medicine

## 2010-10-05 LAB — APTT: aPTT: 25

## 2010-10-05 LAB — BASIC METABOLIC PANEL
BUN: 19
Calcium: 9.1
GFR calc non Af Amer: >60
Potassium: 4
Sodium: 136

## 2010-10-05 LAB — PROTIME-INR: INR: 0.9

## 2010-10-05 LAB — CBC
HCT: 39.1
Hemoglobin: 12.9
Platelets: 272
WBC: 3.5 — ABNORMAL LOW

## 2011-02-06 ENCOUNTER — Other Ambulatory Visit (HOSPITAL_COMMUNITY): Payer: Self-pay | Admitting: Interventional Radiology

## 2011-02-06 DIAGNOSIS — I671 Cerebral aneurysm, nonruptured: Secondary | ICD-10-CM

## 2011-02-07 ENCOUNTER — Encounter (HOSPITAL_COMMUNITY): Payer: Self-pay | Admitting: Pharmacy Technician

## 2011-02-07 ENCOUNTER — Other Ambulatory Visit: Payer: Self-pay | Admitting: Radiology

## 2011-02-10 ENCOUNTER — Ambulatory Visit (HOSPITAL_COMMUNITY)
Admission: RE | Admit: 2011-02-10 | Discharge: 2011-02-10 | Disposition: A | Discharge status: Home / Self Care | Payer: BC Managed Care – PPO | Source: Ambulatory Visit | Attending: Interventional Radiology | Admitting: Interventional Radiology

## 2011-02-10 DIAGNOSIS — I10 Essential (primary) hypertension: Secondary | ICD-10-CM | POA: Insufficient documentation

## 2011-02-10 DIAGNOSIS — I671 Cerebral aneurysm, nonruptured: Secondary | ICD-10-CM | POA: Insufficient documentation

## 2011-02-10 DIAGNOSIS — R51 Headache: Secondary | ICD-10-CM | POA: Insufficient documentation

## 2011-02-10 DIAGNOSIS — Z9889 Other specified postprocedural states: Secondary | ICD-10-CM | POA: Insufficient documentation

## 2011-02-10 DIAGNOSIS — E785 Hyperlipidemia, unspecified: Secondary | ICD-10-CM | POA: Insufficient documentation

## 2011-02-10 DIAGNOSIS — Z09 Encounter for follow-up examination after completed treatment for conditions other than malignant neoplasm: Secondary | ICD-10-CM | POA: Insufficient documentation

## 2011-02-10 LAB — BASIC METABOLIC PANEL
BUN: 14 mg/dL (ref 6–23)
Chloride: 107 mEq/L (ref 96–112)
Creatinine, Ser: 0.61 mg/dL (ref 0.50–1.10)
Glucose, Bld: 88 mg/dL (ref 70–99)
Potassium: 3.4 mEq/L — ABNORMAL LOW (ref 3.5–5.1)

## 2011-02-10 LAB — CBC
HCT: 37.5 % (ref 36.0–46.0)
Hemoglobin: 12.7 g/dL (ref 12.0–15.0)
MCH: 26.7 pg (ref 26.0–34.0)
MCHC: 33.9 g/dL (ref 30.0–36.0)
MCV: 78.9 fL (ref 78.0–100.0)
RDW: 13.7 % (ref 11.5–15.5)

## 2011-02-10 LAB — DIFFERENTIAL
Basophils Absolute: 0 10*3/uL (ref 0.0–0.1)
Basophils Relative: 1 % (ref 0–1)
Eosinophils Absolute: 0.1 10*3/uL (ref 0.0–0.7)
Eosinophils Relative: 1 % (ref 0–5)
Monocytes Absolute: 0.3 10*3/uL (ref 0.1–1.0)
Monocytes Relative: 7 % (ref 3–12)

## 2011-02-10 MED ORDER — SODIUM CHLORIDE 0.9 % IV SOLN
Freq: Once | INTRAVENOUS | Status: AC
  Administered 2011-02-10: 09:00:00 via INTRAVENOUS

## 2011-02-10 MED ORDER — HYDRALAZINE HCL 20 MG/ML IJ SOLN
INTRAMUSCULAR | Status: AC
  Filled 2011-02-10: qty 1

## 2011-02-10 MED ORDER — MIDAZOLAM HCL 5 MG/5ML IJ SOLN
INTRAMUSCULAR | Status: DC | PRN
  Administered 2011-02-10: 1 mg via INTRAVENOUS

## 2011-02-10 MED ORDER — IOHEXOL 300 MG/ML  SOLN
150.0000 mL | Freq: Once | INTRAMUSCULAR | Status: AC | PRN
  Administered 2011-02-10: 60 mL via INTRA_ARTERIAL

## 2011-02-10 MED ORDER — FENTANYL CITRATE 0.05 MG/ML IJ SOLN
INTRAMUSCULAR | Status: AC
  Filled 2011-02-10: qty 2

## 2011-02-10 MED ORDER — SODIUM CHLORIDE 0.9 % IV SOLN: INTRAVENOUS | Status: AC

## 2011-02-10 MED ORDER — MIDAZOLAM HCL 2 MG/2ML IJ SOLN
INTRAMUSCULAR | Status: AC
  Filled 2011-02-10: qty 2

## 2011-02-10 MED ORDER — FENTANYL CITRATE 0.05 MG/ML IJ SOLN
INTRAMUSCULAR | Status: DC | PRN
  Administered 2011-02-10: 25 ug via INTRAVENOUS

## 2011-02-10 MED ORDER — HEPARIN SODIUM (PORCINE) 1000 UNIT/ML IJ SOLN
INTRAMUSCULAR | Status: AC
  Administered 2011-02-10: 500 [IU] via INTRAVENOUS
  Filled 2011-02-10: qty 1

## 2011-02-10 NOTE — H&P (Signed)
Krystal Houston is an 56 y.o. female.   Chief Complaint: " I'm here for my angiogram" HPI: Patient with history of bilateral ICA aneurysms, s/p stenting of right ICA aneurysm and coiling of left ICA aneurysm, presents today for follow up cerebral arteriogram to assess stability.  Past Medical History  Diagnosis Date  . Hyperlipidemia   . Hypertension   . Brain aneurysm     Past Surgical History  Procedure Date  . Cerebral aneurysm repair   . Vaginal deliveries     x 3  . Abdominal hysterectomy     Family History  Problem Relation Age of Onset  . Heart disease Mother   . Diabetes Father   . Coronary artery disease Other   . Diabetes Other    Social History:  does not have a smoking history on file. She does not have any smokeless tobacco history on file. Her alcohol and drug histories not on file.  Allergies:  Allergies  Allergen Reactions  . Sulfamethoxazole     REACTION: vomiting    Medications Prior to Admission  Medication Sig Dispense Refill  . aspirin 325 MG tablet Take 325 mg by mouth daily.        Marland Kitchen lisinopril (PRINIVIL,ZESTRIL) 10 MG tablet Take 1 tablet (10 mg total) by mouth daily.  30 tablet  1   Medications Prior to Admission  Medication Dose Route Frequency Provider Last Rate Last Dose  . 0.9 %  sodium chloride infusion   Intravenous Once Robet Leu, PA 20 mL/hr at 02/10/11 4098      Results for orders placed during the hospital encounter of 02/10/11 (from the past 48 hour(s))  APTT     Status: Normal   Collection Time   02/10/11  8:20 AM      Component Value Range Comment   aPTT 28  24 - 37 (seconds)   CBC     Status: Normal   Collection Time   02/10/11  8:20 AM      Component Value Range Comment   WBC 4.3  4.0 - 10.5 (K/uL)    RBC 4.75  3.87 - 5.11 (MIL/uL)    Hemoglobin 12.7  12.0 - 15.0 (g/dL)    HCT 11.9  14.7 - 82.9 (%)    MCV 78.9  78.0 - 100.0 (fL)    MCH 26.7  26.0 - 34.0 (pg)    MCHC 33.9  30.0 - 36.0 (g/dL)    RDW 56.2  13.0 - 86.5  (%)    Platelets 224  150 - 400 (K/uL)   DIFFERENTIAL     Status: Abnormal   Collection Time   02/10/11  8:20 AM      Component Value Range Comment   Neutrophils Relative 40 (*) 43 - 77 (%)    Neutro Abs 1.7  1.7 - 7.7 (K/uL)    Lymphocytes Relative 51 (*) 12 - 46 (%)    Lymphs Abs 2.2  0.7 - 4.0 (K/uL)    Monocytes Relative 7  3 - 12 (%)    Monocytes Absolute 0.3  0.1 - 1.0 (K/uL)    Eosinophils Relative 1  0 - 5 (%)    Eosinophils Absolute 0.1  0.0 - 0.7 (K/uL)    Basophils Relative 1  0 - 1 (%)    Basophils Absolute 0.0  0.0 - 0.1 (K/uL)   PROTIME-INR     Status: Normal   Collection Time   02/10/11  8:20 AM  Component Value Range Comment   Prothrombin Time 13.3  11.6 - 15.2 (seconds)    INR 0.99  0.00 - 1.49     No results found.  Review of Systems  Constitutional: Negative for fever and chills.  HENT:       Occasional left temporal/ occipital HA'S  Eyes: Positive for pain. Negative for blurred vision and double vision.       Occasional left eye pain  Respiratory: Negative for cough and shortness of breath.   Cardiovascular: Negative for chest pain.  Gastrointestinal: Negative for nausea, vomiting and abdominal pain.  Neurological: Positive for headaches. Negative for dizziness, speech change and focal weakness.       Notes occasional right LE paresthesias  Endo/Heme/Allergies: Does not bruise/bleed easily.    Blood pressure 154/95, pulse 68, temperature 96.7 F (35.9 C), temperature source Oral, resp. rate 18, height 5\' 1"  (1.549 m), weight 150 lb (68.04 kg), SpO2 100.00%. Physical Exam  Constitutional: She is oriented to person, place, and time. She appears well-developed and well-nourished.  Eyes: EOM are normal.  Cardiovascular: Normal rate and regular rhythm.   Respiratory: Effort normal and breath sounds normal.  GI: Soft. Bowel sounds are normal. She exhibits no distension. There is no tenderness.  Musculoskeletal: Normal range of motion. She exhibits no  edema.  Neurological: She is alert and oriented to person, place, and time. No cranial nerve deficit. Coordination normal.     Assessment/Plan Patient with history of bilateral ICA aneurysms, s/p stenting of right ICA aneurysm and coiling of left ICA aneurysm; plan is for follow up cerebral arteriogram today to assess stability.   Kimmi Acocella,D KEVIN 02/10/2011, 9:03 AM

## 2011-02-10 NOTE — Procedures (Signed)
S/ P bilateral carotid arteriograms. Rt CFA approach Preliminary findings  1.. 3.81mm x 2.5 mm lt ICA neck remnant. 2. Stable  Small  rt ICA  periopth aneurysm

## 2011-02-13 ENCOUNTER — Telehealth (HOSPITAL_COMMUNITY): Payer: Self-pay

## 2011-07-28 ENCOUNTER — Ambulatory Visit (INDEPENDENT_AMBULATORY_CARE_PROVIDER_SITE_OTHER): Payer: BC Managed Care – PPO | Admitting: Family Medicine

## 2011-07-28 ENCOUNTER — Encounter (HOSPITAL_COMMUNITY): Payer: Self-pay | Admitting: Emergency Medicine

## 2011-07-28 ENCOUNTER — Emergency Department (HOSPITAL_COMMUNITY)
Admission: EM | Admit: 2011-07-28 | Discharge: 2011-07-28 | Disposition: A | Discharge status: Home / Self Care | Payer: BC Managed Care – PPO | Attending: Emergency Medicine | Admitting: Emergency Medicine

## 2011-07-28 ENCOUNTER — Encounter: Payer: Self-pay | Admitting: Family Medicine

## 2011-07-28 VITALS — BP 140/90 | HR 80 | Wt 145.0 lb

## 2011-07-28 DIAGNOSIS — I10 Essential (primary) hypertension: Secondary | ICD-10-CM

## 2011-07-28 DIAGNOSIS — R51 Headache: Secondary | ICD-10-CM | POA: Insufficient documentation

## 2011-07-28 DIAGNOSIS — I671 Cerebral aneurysm, nonruptured: Secondary | ICD-10-CM

## 2011-07-28 NOTE — ED Notes (Signed)
Called patient in [place] and triage with no answer at this time

## 2011-07-28 NOTE — ED Notes (Signed)
Pt sts she has woke up with a headache everyday this week.   St's she saw her MD today and was told to come to ED for further work up

## 2011-07-28 NOTE — Progress Notes (Signed)
  Subjective:    Patient ID: Krystal Houston, female    DOB: 1955-01-15, 56 y.o.   MRN: [id number]  HPI Here to discuss problems she has had for the past month but which are getting worse this week. She has known intracranial aneurysms, and the last scan to look at these was done last February per Dr. Corliss Skains. She has not seen Dr. Cato Mulligan, her PCP, for several years. She has HTN, but admits to not taking her meds for many months. This week she has had steadily worsening HAs on the left side of the head and pains around the left eye. She wakes up with these HAs every morning, and they are fairly severe. They then ease off somewhat during the day. She takes nothing for them. She also admits to some blurred vision and some lightheadedness and unsteady gait this week. She fell once at home and bruised her leg. No slurred speech or altered consciousness.    Review of Systems  Constitutional: Negative.   Eyes: Positive for pain and visual disturbance. Negative for photophobia and redness.  Respiratory: Negative.   Cardiovascular: Negative.   Neurological: Positive for dizziness, light-headedness and headaches. Negative for tremors, seizures, syncope, facial asymmetry, speech difficulty, weakness and numbness.       Objective:   Physical Exam  Constitutional: She is oriented to person, place, and time. She appears well-developed and well-nourished.       Alert, in no distress. Walks and gets on the exam table easily. Gait is normal.   HENT:  Head: Normocephalic and atraumatic.  Eyes: Conjunctivae and EOM are normal. Pupils are equal, round, and reactive to light.  Neck: Neck supple. No thyromegaly present.  Cardiovascular: Normal rate, regular rhythm, normal heart sounds and intact distal pulses.   Pulmonary/Chest: Effort normal and breath sounds normal.  Lymphadenopathy:    She has no cervical adenopathy.  Neurological: She is alert and oriented to person, place, and time. She has normal  reflexes. No cranial nerve deficit. She exhibits normal muscle tone. Coordination normal.          Assessment & Plan:  With her hx of intracranial aneurysms, this pattern of HAs and dizziness is very worrisome. She needs to go to [facility] ER immediately for further evaluation and treatment. She needs a CT scan of the head to assess her aneurysms. I spoke to her about the necessity of treating her HTN and of taking her meds as prescribed. She will drive herself straight to the ER now.

## 2011-07-29 ENCOUNTER — Encounter (HOSPITAL_COMMUNITY): Payer: Self-pay | Admitting: Physical Medicine and Rehabilitation

## 2011-07-29 ENCOUNTER — Emergency Department (HOSPITAL_COMMUNITY)
Admission: EM | Admit: 2011-07-29 | Discharge: 2011-07-30 | Disposition: A | Discharge status: Home / Self Care | Payer: BC Managed Care – PPO | Attending: Emergency Medicine | Admitting: Emergency Medicine

## 2011-07-29 ENCOUNTER — Emergency Department (HOSPITAL_COMMUNITY): Payer: BC Managed Care – PPO

## 2011-07-29 DIAGNOSIS — Z8249 Family history of ischemic heart disease and other diseases of the circulatory system: Secondary | ICD-10-CM | POA: Insufficient documentation

## 2011-07-29 DIAGNOSIS — R51 Headache: Secondary | ICD-10-CM

## 2011-07-29 DIAGNOSIS — I10 Essential (primary) hypertension: Secondary | ICD-10-CM | POA: Insufficient documentation

## 2011-07-29 DIAGNOSIS — Z90710 Acquired absence of both cervix and uterus: Secondary | ICD-10-CM | POA: Insufficient documentation

## 2011-07-29 DIAGNOSIS — Z833 Family history of diabetes mellitus: Secondary | ICD-10-CM | POA: Insufficient documentation

## 2011-07-29 DIAGNOSIS — Z8679 Personal history of other diseases of the circulatory system: Secondary | ICD-10-CM | POA: Insufficient documentation

## 2011-07-29 DIAGNOSIS — E785 Hyperlipidemia, unspecified: Secondary | ICD-10-CM | POA: Insufficient documentation

## 2011-07-29 MED ORDER — ONDANSETRON HCL 4 MG/2ML IJ SOLN
4.0000 mg | Freq: Once | INTRAMUSCULAR | Status: AC
  Administered 2011-07-29: 4 mg via INTRAVENOUS
  Filled 2011-07-29: qty 2

## 2011-07-29 MED ORDER — KETOROLAC TROMETHAMINE 30 MG/ML IJ SOLN
30.0000 mg | Freq: Once | INTRAMUSCULAR | Status: AC
  Administered 2011-07-29: 30 mg via INTRAVENOUS
  Filled 2011-07-29: qty 1

## 2011-07-29 MED ORDER — IOHEXOL 350 MG/ML SOLN
50.0000 mL | Freq: Once | INTRAVENOUS | Status: AC | PRN
  Administered 2011-07-29: 50 mL via INTRAVENOUS

## 2011-07-29 NOTE — ED Notes (Signed)
Pt presents to department for evaluation of headache. Was seen yesterday for same, was told to come to ED for CT of head by PCP but had to leave AMA. States 9/10 "thobbing" headache with blurred vision. No nausea/vomiting. She is conscious alert and oriented x4. No signs of distress noted.

## 2011-07-29 NOTE — ED Provider Notes (Signed)
History     CSN: [id number]  Arrival date & time 07/29/11  7829   First MD Initiated Contact with Patient 07/29/11 1948      Chief Complaint  Patient presents with  . Headache    (Consider location/radiation/quality/duration/timing/severity/associated sxs/prior treatment) Patient is a 56 y.o. female presenting with headaches. The history is provided by the patient.  Headache  This is a recurrent problem. The current episode started more than 1 week ago. The problem occurs constantly. The headache is associated with nothing. The pain is located in the left unilateral region. The quality of the pain is described as dull. The pain is moderate. Radiates to: left eye area. Pertinent negatives include no fever, no malaise/fatigue, no chest pressure, no near-syncope, no orthopnea, no palpitations, no syncope, no shortness of breath, no nausea and no vomiting. She has tried nothing for the symptoms. The treatment provided no relief.    Past Medical History  Diagnosis Date  . Hyperlipidemia   . Hypertension   . Brain aneurysm     Past Surgical History  Procedure Date  . Cerebral aneurysm repair   . Vaginal deliveries     x 3  . Abdominal hysterectomy     Family History  Problem Relation Age of Onset  . Heart disease Mother   . Diabetes Father   . Coronary artery disease Other   . Diabetes Other     History  Substance Use Topics  . Smoking status: Never Smoker   . Smokeless tobacco: Never Used  . Alcohol Use: No    OB History    Grav Para Term Preterm Abortions TAB SAB Ect Mult Living                  Review of Systems  Constitutional: Negative for fever, chills, malaise/fatigue, diaphoresis and fatigue.  HENT: Negative for ear pain, congestion, sore throat, facial swelling, mouth sores, trouble swallowing, neck pain and neck stiffness.   Eyes: Negative.   Respiratory: Negative for apnea, cough, chest tightness, shortness of breath and wheezing.   Cardiovascular:  Negative for chest pain, palpitations, orthopnea, leg swelling, syncope and near-syncope.  Gastrointestinal: Negative for nausea, vomiting, abdominal pain, diarrhea and abdominal distention.  Genitourinary: Negative for hematuria, flank pain, vaginal discharge, difficulty urinating and menstrual problem.  Musculoskeletal: Negative for back pain and gait problem.  Skin: Negative for rash and wound.  Neurological: Positive for headaches. Negative for dizziness, tremors, seizures, syncope, facial asymmetry and numbness.  Psychiatric/Behavioral: Negative.   All other systems reviewed and are negative.    Allergies  Sulfamethoxazole  Home Medications   Current Outpatient Rx  Name Route Sig Dispense Refill  . ASPIRIN EC 81 MG PO TBEC Oral Take 81 mg by mouth daily.      BP 120/77  Pulse 65  Temp 98.2 F (36.8 C) (Oral)  Resp 16  SpO2 99%  Physical Exam  Nursing note and vitals reviewed. Constitutional: She is oriented to person, place, and time. She appears well-developed and well-nourished. No distress.  HENT:  Head: Normocephalic and atraumatic.  Right Ear: External ear normal.  Left Ear: External ear normal.  Nose: Nose normal.  Mouth/Throat: Oropharynx is clear and moist. No oropharyngeal exudate.  Eyes: Conjunctivae and EOM are normal. Pupils are equal, round, and reactive to light. Right eye exhibits no discharge. Left eye exhibits no discharge.  Neck: Normal range of motion. Neck supple. No JVD present. No tracheal deviation present. No thyromegaly present.  Cardiovascular: Normal rate,  regular rhythm, normal heart sounds and intact distal pulses.  Exam reveals no gallop and no friction rub.   No murmur heard. Pulmonary/Chest: Effort normal and breath sounds normal. No respiratory distress. She has no wheezes. She has no rales. She exhibits no tenderness.  Abdominal: Soft. Bowel sounds are normal. She exhibits no distension. There is no tenderness. There is no rebound and  no guarding.  Musculoskeletal: Normal range of motion.  Lymphadenopathy:    She has no cervical adenopathy.  Neurological: She is alert and oriented to person, place, and time. She has normal strength. No cranial nerve deficit or sensory deficit. Coordination normal.       Patient with 5 out of 5 strength in all extremities normal sensation in all extremities. Normal finger-nose heel shin no truncal or gait ataxia no nystagmus normal cranial nerve exam. No nausea vomiting no visual field changes. patient was able to walk without ataxia. Downgoing Babinski  Skin: Skin is warm. No rash noted. She is not diaphoretic.  Psychiatric: She has a normal mood and affect. Her behavior is normal. Judgment and thought content normal.    ED Course  Procedures (including critical care time)  Labs Reviewed - No data to display No results found.   No diagnosis found.    MDM  56 year old female patient with past medical history of multiple aneurysms requiring endovascular coiling procedures presents with recurrence of headache similar to previous headache associated with aneurysms. Patient says that she's felt somewhat unsteady when she walks. Patient says her pain is 910 and similar to when she had aneurysm in the past. Patient with normal neurological exam as described above along with normal cerebellar exam but given the description in her past medical history we'll get a CT angiogram to ensure that she's not having worsening disease caused by her aneurysms.  Patient pain improves with one dose of Toradol and now feels asymptomatic. CT was reviewed with the on-call radiologist who notes that aneurysms have not changed the characterization and there is no acute bleed. Now patient symptoms have abated we'll have patient followup with her primary care physician and return to the emergency department with any worsening headaches nausea vomiting.  Case discussed with Dr. Allison Quarry,  MD 07/29/11 2325

## 2011-07-29 NOTE — ED Notes (Signed)
Patient is resting comfortably. 

## 2011-07-29 NOTE — ED Provider Notes (Signed)
I saw and evaluated the patient, reviewed the resident's note and I agree with the findings and plan. 56 year old, female, with a history of cerebral aneurysm that had to be treated with coils twice.  The most recent treatment approximately 2 years ago.  She presents emergency department complaining of a headache.  On left side for approximately one week.  She also has dizziness.  She denies fevers, chills, nausea, vomiting, neck pain, or rash.  On physical examination.  Her neurological exam is completely normal.  She is in no distress.  Following treatment in emergency department.  Her ha is resolved.  Cheri Guppy, MD 07/29/11 [phone]

## 2011-07-29 NOTE — ED Notes (Signed)
Patient says she has had a headache all day and also has had problems with gait due to dizziness.

## 2011-07-29 NOTE — ED Provider Notes (Signed)
I saw and evaluated the patient, reviewed the resident's note and I agree with the findings and plan.  Ritha Sampedro, MD 07/29/11 2342 

## 2011-07-30 NOTE — ED Notes (Signed)
Patient is alert and oriented x4 with no complaints of pain.  Patient verbalized understanding of discharge instructions.  Patient able to walk and advised she was able to drive herself home since she drove herself here.

## 2011-08-03 ENCOUNTER — Encounter: Payer: Self-pay | Admitting: Family

## 2011-08-03 ENCOUNTER — Ambulatory Visit (INDEPENDENT_AMBULATORY_CARE_PROVIDER_SITE_OTHER): Payer: BC Managed Care – PPO | Admitting: Family

## 2011-08-03 ENCOUNTER — Telehealth: Payer: Self-pay | Admitting: Internal Medicine

## 2011-08-03 VITALS — BP 134/80 | HR 72 | Temp 98.2°F | Resp 16 | Ht 61.0 in | Wt 144.0 lb

## 2011-08-03 DIAGNOSIS — I10 Essential (primary) hypertension: Secondary | ICD-10-CM

## 2011-08-03 MED ORDER — LISINOPRIL 10 MG PO TABS: 10.0000 mg | ORAL_TABLET | Freq: Every day | ORAL | Status: DC

## 2011-08-03 NOTE — Telephone Encounter (Signed)
As long as Dr. Cato Mulligan agrees.

## 2011-08-03 NOTE — Patient Instructions (Addendum)

## 2011-08-03 NOTE — Telephone Encounter (Signed)
Patient is requesting a switch in primary care - from Dr. Cato Mulligan to Adline Mango.

## 2011-08-04 NOTE — Progress Notes (Signed)
  Subjective:    Patient ID: Krystal Houston, female    DOB: March 04, 1955, 56 y.o.   MRN: [id number]  HPI 56 year old AAF is in for a recheck of hypertension. She currently takes Lisinopril and tolerates it well. She is requesting a refill. She does not routinely exercise. She attempts to follow a low sodium diet.    Review of Systems  Constitutional: Negative.   Respiratory: Negative.   Cardiovascular: Negative.   Gastrointestinal: Negative.   Musculoskeletal: Negative.   Skin: Negative.   Neurological: Negative.   Hematological: Negative.   Psychiatric/Behavioral: Negative.    Past Medical History  Diagnosis Date  . Hyperlipidemia   . Hypertension   . Brain aneurysm     History   Social History  . Marital Status: Legally Separated    Spouse Name: N/A    Number of Children: N/A  . Years of Education: N/A   Occupational History  . Not on file.   Social History Main Topics  . Smoking status: Never Smoker   . Smokeless tobacco: Never Used  . Alcohol Use: No  . Drug Use: No  . Sexually Active: Not on file   Other Topics Concern  . Not on file   Social History Narrative  . No narrative on file    Past Surgical History  Procedure Date  . Cerebral aneurysm repair   . Vaginal deliveries     x 3  . Abdominal hysterectomy     Family History  Problem Relation Age of Onset  . Heart disease Mother   . Diabetes Father   . Coronary artery disease Other   . Diabetes Other     Allergies  Allergen Reactions  . Sulfamethoxazole     REACTION: vomiting    Current Outpatient Prescriptions on File Prior to Visit  Medication Sig Dispense Refill  . aspirin EC 81 MG tablet Take 81 mg by mouth daily.        BP 134/80  Pulse 72  Temp 98.2 F (36.8 C)  Resp 16  Ht 5\' 1"  (1.549 m)  Wt 144 lb (65.318 kg)  BMI 27.21 kg/m2chart    Objective:   Physical Exam  Constitutional: She is oriented to person, place, and time. She appears well-developed and well-nourished.    HENT:  Right Ear: External ear normal.  Left Ear: External ear normal.  Nose: Nose normal.  Mouth/Throat: Oropharynx is clear and moist.  Neck: Normal range of motion. Neck supple.  Cardiovascular: Normal rate, regular rhythm and normal heart sounds.   Pulmonary/Chest: Effort normal and breath sounds normal.  Neurological: She is alert and oriented to person, place, and time.  Skin: Skin is warm and dry.  Psychiatric: She has a normal mood and affect.          Assessment & Plan:  Assessment: Hypertension  Plan: Refilled meds. Advised CPX. Call the office with any questions or concerns. Recheck as scheduled and sooner as needed.

## 2011-08-04 NOTE — Telephone Encounter (Signed)
Ok,, i haen't seen in 3 years

## 2011-08-08 NOTE — Telephone Encounter (Signed)
Attempted to call pt. Call back # is disconnected. If patient calls back, it is OK to switch to [facility] [facility].

## 2011-08-08 NOTE — Telephone Encounter (Signed)
Krystal Houston, can you please call this pt back and let them know it is ok to switch & see is she wants to make an appt? Thanks.
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
## 2011-10-14 ENCOUNTER — Emergency Department (INDEPENDENT_AMBULATORY_CARE_PROVIDER_SITE_OTHER)
Admission: EM | Admit: 2011-10-14 | Discharge: 2011-10-14 | Disposition: A | Discharge status: Home / Self Care | Payer: BC Managed Care – PPO | Source: Home / Self Care

## 2011-10-14 ENCOUNTER — Encounter (HOSPITAL_COMMUNITY): Payer: Self-pay | Admitting: Emergency Medicine

## 2011-10-14 ENCOUNTER — Ambulatory Visit: Payer: BC Managed Care – PPO

## 2011-10-14 DIAGNOSIS — N390 Urinary tract infection, site not specified: Secondary | ICD-10-CM

## 2011-10-14 LAB — POCT URINALYSIS DIP (DEVICE)
Bilirubin Urine: NEGATIVE
Glucose, UA: NEGATIVE mg/dL
Nitrite: POSITIVE — AB
Urobilinogen, UA: 0.2 mg/dL (ref 0.0–1.0)

## 2011-10-14 MED ORDER — CIPROFLOXACIN HCL 500 MG PO TABS: 500.0000 mg | ORAL_TABLET | Freq: Two times a day (BID) | ORAL | Status: DC

## 2011-10-14 NOTE — ED Provider Notes (Signed)
History     CSN: [id number]  Arrival date & time 10/14/11  1039   None     Chief Complaint  Patient presents with  . Urinary Tract Infection    (Consider location/radiation/quality/duration/timing/severity/associated sxs/prior treatment) Patient is a 56 y.o. female presenting with urinary tract infection. The history is provided by the patient. No language interpreter was used.  Urinary Tract Infection This is a new problem. The current episode started 2 days ago. The problem occurs constantly. The problem has been gradually worsening. Associated symptoms include abdominal pain. Nothing aggravates the symptoms. Nothing relieves the symptoms.  Pt has a history of uti.  Pt complains of same symptoms currently  Past Medical History  Diagnosis Date  . Hyperlipidemia   . Hypertension   . Brain aneurysm     Past Surgical History  Procedure Date  . Cerebral aneurysm repair   . Vaginal deliveries     x 3  . Abdominal hysterectomy     Family History  Problem Relation Age of Onset  . Heart disease Mother   . Diabetes Father   . Coronary artery disease Other   . Diabetes Other     History  Substance Use Topics  . Smoking status: Never Smoker   . Smokeless tobacco: Never Used  . Alcohol Use: No    OB History    Grav Para Term Preterm Abortions TAB SAB Ect Mult Living                  Review of Systems  Gastrointestinal: Positive for abdominal pain.  All other systems reviewed and are negative.    Allergies  Sulfamethoxazole  Home Medications   Current Outpatient Rx  Name Route Sig Dispense Refill  . ASPIRIN EC 81 MG PO TBEC Oral Take 81 mg by mouth daily.    Marland Kitchen LISINOPRIL 10 MG PO TABS Oral Take 1 tablet (10 mg total) by mouth daily. 30 tablet 5    There were no vitals taken for this visit.  Physical Exam  Nursing note and vitals reviewed. Constitutional: She appears well-developed and well-nourished.  HENT:  Head: Normocephalic and atraumatic.    Right Ear: External ear normal.  Left Ear: External ear normal.  Eyes: EOM are normal. Pupils are equal, round, and reactive to light.  Cardiovascular: Normal rate and regular rhythm.   Pulmonary/Chest: Effort normal.  Abdominal: Soft.  Musculoskeletal: Normal range of motion.  Neurological: She is alert.  Skin: Skin is warm.  Psychiatric: She has a normal mood and affect.    ED Course  Procedures (including critical care time)  Labs Reviewed  POCT URINALYSIS DIP (DEVICE) - Abnormal; Notable for the following:    Hgb urine dipstick SMALL (*)     pH 8.5 (*)     Protein, ur 30 (*)     Nitrite POSITIVE (*)     Leukocytes, UA SMALL (*)  Biochemical Testing Only. Please order routine urinalysis from main lab if confirmatory testing is needed.   All other components within normal limits   No results found.   No diagnosis found.    MDM  Urine culture ordered.  Pt advised to follow up with her MD for recheck next week        Lonia Skinner [place], [place] 10/14/11 1230

## 2011-10-14 NOTE — ED Notes (Addendum)
Pt c/o uti symptoms. C/o frequency and noticed and odor. Pt denies any abnormal discharge or back pain.  Pt states she is having some flank pain.

## 2011-10-16 NOTE — ED Provider Notes (Signed)
Medical screening examination/treatment/procedure(s) were performed by non-physician practitioner and as supervising physician I was immediately available for consultation/collaboration.  Leslee Home, M.D.   Reuben Likes, MD 10/16/11 [phone]

## 2011-11-24 ENCOUNTER — Ambulatory Visit (INDEPENDENT_AMBULATORY_CARE_PROVIDER_SITE_OTHER): Payer: BC Managed Care – PPO | Admitting: Family

## 2011-11-24 ENCOUNTER — Encounter: Payer: Self-pay | Admitting: Family

## 2011-11-24 VITALS — BP 130/88 | Temp 98.2°F | Wt 150.0 lb

## 2011-11-24 DIAGNOSIS — R350 Frequency of micturition: Secondary | ICD-10-CM

## 2011-11-24 DIAGNOSIS — R300 Dysuria: Secondary | ICD-10-CM

## 2011-11-24 LAB — POCT URINALYSIS DIPSTICK
Glucose, UA: NEGATIVE
Leukocytes, UA: NEGATIVE
Nitrite, UA: NEGATIVE
Protein, UA: NEGATIVE
Spec Grav, UA: 1.02
Urobilinogen, UA: 0.2

## 2011-11-24 MED ORDER — NITROFURANTOIN MONOHYD MACRO 100 MG PO CAPS: 100.0000 mg | ORAL_CAPSULE | Freq: Two times a day (BID) | ORAL | Status: DC

## 2011-11-24 NOTE — Progress Notes (Signed)
  Subjective:    Patient ID: Krystal Houston, female    DOB: 1955-10-13, 56 y.o.   MRN: [id number]  HPI 56 year old African American female, nonsmoker, patient of Dr. Cato Mulligan is in today with complaints of urinary frequency, urgency, and burning with urination x3 days. Patient was positive for urinary tract infection at urgent care clinic 5 weeks ago was treated with Cipro. Reports having similar symptoms today. She denies any concerns any sexually transmitted diseases, no vaginal discharge or odor.   Review of Systems  Constitutional: Negative.   Respiratory: Negative.   Cardiovascular: Negative.   Genitourinary: Positive for dysuria, urgency and frequency.  Musculoskeletal: Negative.   Skin: Negative.   Neurological: Negative.   Psychiatric/Behavioral: Negative.    Past Medical History  Diagnosis Date  . Hyperlipidemia   . Hypertension   . Brain aneurysm     History   Social History  . Marital Status: Legally Separated    Spouse Name: N/A    Number of Children: N/A  . Years of Education: N/A   Occupational History  . Not on file.   Social History Main Topics  . Smoking status: Never Smoker   . Smokeless tobacco: Never Used  . Alcohol Use: No  . Drug Use: No  . Sexually Active: Not on file   Other Topics Concern  . Not on file   Social History Narrative  . No narrative on file    Past Surgical History  Procedure Date  . Cerebral aneurysm repair   . Vaginal deliveries     x 3  . Abdominal hysterectomy     Family History  Problem Relation Age of Onset  . Heart disease Mother   . Diabetes Father   . Coronary artery disease Other   . Diabetes Other     Allergies  Allergen Reactions  . Sulfamethoxazole     REACTION: vomiting    Current Outpatient Prescriptions on File Prior to Visit  Medication Sig Dispense Refill  . aspirin EC 81 MG tablet Take 81 mg by mouth daily.      Marland Kitchen lisinopril (PRINIVIL,ZESTRIL) 10 MG tablet Take 1 tablet (10 mg total) by  mouth daily.  30 tablet  5  . ciprofloxacin (CIPRO) 500 MG tablet Take 1 tablet (500 mg total) by mouth 2 (two) times daily.  20 tablet  0    BP 130/88  Temp 98.2 F (36.8 C) (Oral)  Wt 150 lb (68.04 kg)chart    Objective:   Physical Exam  Constitutional: She is oriented to person, place, and time. She appears well-developed and well-nourished.  Neck: Normal range of motion. Neck supple.  Cardiovascular: Normal rate, regular rhythm and normal heart sounds.   Pulmonary/Chest: Effort normal and breath sounds normal.  Abdominal: Soft. Bowel sounds are normal. There is no tenderness. There is no rebound and no guarding.  Neurological: She is alert and oriented to person, place, and time.  Skin: Skin is warm and dry.  Psychiatric: She has a normal mood and affect.          Assessment & Plan:  Assessment: Urinary Frequency, Dysuria  Plan: Macrobid twice a day x 7 days. Urine culture sent. Patient call the office symptoms worsen or persist. Recheck as scheduled and when necessary.

## 2011-11-24 NOTE — Patient Instructions (Signed)
The patient is instructed to continue all medications as prescribed. Schedule followup with check out clerk upon leaving the clinic  

## 2011-11-26 LAB — URINE CULTURE: Colony Count: >=100000

## 2012-01-08 ENCOUNTER — Ambulatory Visit: Payer: BC Managed Care – PPO | Admitting: Family

## 2012-01-09 ENCOUNTER — Ambulatory Visit: Payer: BC Managed Care – PPO | Admitting: Family

## 2012-01-09 DIAGNOSIS — Z0289 Encounter for other administrative examinations: Secondary | ICD-10-CM

## 2012-02-12 ENCOUNTER — Other Ambulatory Visit: Payer: Self-pay | Admitting: Radiology

## 2012-02-12 ENCOUNTER — Other Ambulatory Visit (HOSPITAL_COMMUNITY): Payer: Self-pay | Admitting: Interventional Radiology

## 2012-02-12 DIAGNOSIS — I729 Aneurysm of unspecified site: Secondary | ICD-10-CM

## 2012-02-13 ENCOUNTER — Encounter (HOSPITAL_COMMUNITY): Payer: Self-pay

## 2012-02-14 ENCOUNTER — Ambulatory Visit (HOSPITAL_COMMUNITY)
Admission: RE | Admit: 2012-02-14 | Discharge: 2012-02-14 | Disposition: A | Discharge status: Home / Self Care | Payer: BC Managed Care – PPO | Source: Ambulatory Visit | Attending: Interventional Radiology | Admitting: Interventional Radiology

## 2012-02-14 ENCOUNTER — Other Ambulatory Visit (HOSPITAL_COMMUNITY): Payer: Self-pay | Admitting: Interventional Radiology

## 2012-02-14 DIAGNOSIS — I671 Cerebral aneurysm, nonruptured: Secondary | ICD-10-CM | POA: Insufficient documentation

## 2012-02-14 DIAGNOSIS — I729 Aneurysm of unspecified site: Secondary | ICD-10-CM

## 2012-02-14 DIAGNOSIS — R51 Headache: Secondary | ICD-10-CM | POA: Insufficient documentation

## 2012-02-14 DIAGNOSIS — Z09 Encounter for follow-up examination after completed treatment for conditions other than malignant neoplasm: Secondary | ICD-10-CM | POA: Insufficient documentation

## 2012-02-14 DIAGNOSIS — Z9889 Other specified postprocedural states: Secondary | ICD-10-CM | POA: Insufficient documentation

## 2012-02-14 LAB — CBC WITH DIFFERENTIAL/PLATELET
Basophils Absolute: 0 10*3/uL (ref 0.0–0.1)
Basophils Relative: 1 % (ref 0–1)
HCT: 38 % (ref 36.0–46.0)
Hemoglobin: 12.6 g/dL (ref 12.0–15.0)
Lymphocytes Relative: 56 % — ABNORMAL HIGH (ref 12–46)
MCHC: 33.2 g/dL (ref 30.0–36.0)
Monocytes Absolute: 0.2 10*3/uL (ref 0.1–1.0)
Neutro Abs: 1.1 10*3/uL — ABNORMAL LOW (ref 1.7–7.7)
Neutrophils Relative %: 35 % — ABNORMAL LOW (ref 43–77)
RDW: 13.8 % (ref 11.5–15.5)
WBC: 3.2 10*3/uL — ABNORMAL LOW (ref 4.0–10.5)

## 2012-02-14 LAB — BASIC METABOLIC PANEL
Chloride: 107 mEq/L (ref 96–112)
Creatinine, Ser: 0.77 mg/dL (ref 0.50–1.10)
GFR calc Af Amer: >90 mL/min (ref >90)
GFR calc non Af Amer: >90 mL/min (ref >90)
Potassium: 3.7 mEq/L (ref 3.5–5.1)

## 2012-02-14 LAB — APTT: aPTT: 28 seconds (ref 24–37)

## 2012-02-14 LAB — PROTIME-INR: INR: 0.99 (ref 0.00–1.49)

## 2012-02-14 MED ORDER — MIDAZOLAM HCL 2 MG/2ML IJ SOLN
INTRAMUSCULAR | Status: AC | PRN
  Administered 2012-02-14: 1 mg via INTRAVENOUS

## 2012-02-14 MED ORDER — SODIUM CHLORIDE 0.9 % IV SOLN: INTRAVENOUS | Status: AC

## 2012-02-14 MED ORDER — IOHEXOL 300 MG/ML  SOLN
150.0000 mL | Freq: Once | INTRAMUSCULAR | Status: AC | PRN
  Administered 2012-02-14: 60 mL via INTRA_ARTERIAL

## 2012-02-14 MED ORDER — MIDAZOLAM HCL 2 MG/2ML IJ SOLN
INTRAMUSCULAR | Status: AC
  Filled 2012-02-14: qty 2

## 2012-02-14 MED ORDER — FENTANYL CITRATE 0.05 MG/ML IJ SOLN
INTRAMUSCULAR | Status: AC
  Filled 2012-02-14: qty 2

## 2012-02-14 MED ORDER — FENTANYL CITRATE 0.05 MG/ML IJ SOLN
INTRAMUSCULAR | Status: AC | PRN
  Administered 2012-02-14: 25 ug via INTRAVENOUS

## 2012-02-14 MED ORDER — HEPARIN SOD (PORK) LOCK FLUSH 100 UNIT/ML IV SOLN
INTRAVENOUS | Status: AC | PRN
  Administered 2012-02-14: 500 [IU] via INTRAVENOUS

## 2012-02-14 MED ORDER — SODIUM CHLORIDE 0.9 % IV SOLN
INTRAVENOUS | Status: DC
  Administered 2012-02-14: 1000 mL via INTRAVENOUS

## 2012-02-14 NOTE — Procedures (Signed)
S/P Bilateral arteriogram and Rt Vert arteriogram  RT CFA approach  Findings  1.2.60mmx 2.60mm LT ICA neck remnant 2.1.5 mm LT ICA sup hypophyseal aneurysm

## 2012-02-14 NOTE — H&P (Signed)
Krystal Houston is an 57 y.o. female.   Chief Complaint: "I'm here for my angiogram" HPI: Patient with history of intracranial aneurysms, s/p stenting of right ICA aneurysm 2009/2010 and stent assisted coiling of a left ICA aneurysm 2009, presents today for follow up cerebral arteriogram to assess stability.  Past Medical History  Diagnosis Date  . Hyperlipidemia   . Hypertension   . Brain aneurysm     Past Surgical History  Procedure Laterality Date  . Cerebral aneurysm repair    . Vaginal deliveries      x 3  . Abdominal hysterectomy      Family History  Problem Relation Age of Onset  . Heart disease Mother   . Diabetes Father   . Coronary artery disease Other   . Diabetes Other    Social History:  reports that she has never smoked. She has never used smokeless tobacco. She reports that she does not drink alcohol or use illicit drugs.  Allergies:  Allergies  Allergen Reactions  . Sulfamethoxazole Rash and Other (See Comments)    REACTION: vomiting    Current outpatient prescriptions:aspirin EC 81 MG tablet, Take 81 mg by mouth daily., Disp: , Rfl: ;  lisinopril (PRINIVIL,ZESTRIL) 10 MG tablet, Take 1 tablet (10 mg total) by mouth daily., Disp: 30 tablet, Rfl: 5 Current facility-administered medications:0.9 %  sodium chloride infusion, , Intravenous, Continuous, Brayton El, PA, Last Rate: 20 mL/hr at 02/14/12 0740, 1,000 mL at 02/14/12 0740   Results for orders placed during the hospital encounter of 02/14/12 (from the past 48 hour(s))  CBC WITH DIFFERENTIAL     Status: Abnormal   Collection Time    02/14/12  7:38 AM      Result Value Range   WBC 3.2 (*) 4.0 - 10.5 K/uL   RBC 4.84  3.87 - 5.11 MIL/uL   Hemoglobin 12.6  12.0 - 15.0 g/dL   HCT 16.1  09.6 - 04.5 %   MCV 78.5  78.0 - 100.0 fL   MCH 26.0  26.0 - 34.0 pg   MCHC 33.2  30.0 - 36.0 g/dL   RDW 40.9  81.1 - 91.4 %   Platelets 243  150 - 400 K/uL   Neutrophils Relative 35 (*) 43 - 77 %   Neutro Abs 1.1 (*)  1.7 - 7.7 K/uL   Lymphocytes Relative 56 (*) 12 - 46 %   Lymphs Abs 1.8  0.7 - 4.0 K/uL   Monocytes Relative 7  3 - 12 %   Monocytes Absolute 0.2  0.1 - 1.0 K/uL   Eosinophils Relative 2  0 - 5 %   Eosinophils Absolute 0.1  0.0 - 0.7 K/uL   Basophils Relative 1  0 - 1 %   Basophils Absolute 0.0  0.0 - 0.1 K/uL   Results for orders placed during the hospital encounter of 02/14/12  APTT      Result Value Range   aPTT 28  24 - 37 seconds  CBC WITH DIFFERENTIAL      Result Value Range   WBC 3.2 (*) 4.0 - 10.5 K/uL   RBC 4.84  3.87 - 5.11 MIL/uL   Hemoglobin 12.6  12.0 - 15.0 g/dL   HCT 78.2  95.6 - 21.3 %   MCV 78.5  78.0 - 100.0 fL   MCH 26.0  26.0 - 34.0 pg   MCHC 33.2  30.0 - 36.0 g/dL   RDW 08.6  57.8 - 46.9 %   Platelets 243  150 - 400 K/uL   Neutrophils Relative 35 (*) 43 - 77 %   Neutro Abs 1.1 (*) 1.7 - 7.7 K/uL   Lymphocytes Relative 56 (*) 12 - 46 %   Lymphs Abs 1.8  0.7 - 4.0 K/uL   Monocytes Relative 7  3 - 12 %   Monocytes Absolute 0.2  0.1 - 1.0 K/uL   Eosinophils Relative 2  0 - 5 %   Eosinophils Absolute 0.1  0.0 - 0.7 K/uL   Basophils Relative 1  0 - 1 %   Basophils Absolute 0.0  0.0 - 0.1 K/uL  PROTIME-INR      Result Value Range   Prothrombin Time 13.0  11.6 - 15.2 seconds   INR 0.99  0.00 - 1.49   02/14/2012 BMP pending Review of Systems  Constitutional: Negative for fever and chills.  HENT:       Occ HA's  Respiratory: Negative for cough and shortness of breath.   Cardiovascular: Negative for chest pain.  Gastrointestinal: Negative for nausea, vomiting and abdominal pain.  Musculoskeletal: Negative for back pain.  Neurological: Negative for dizziness, tingling, speech change and focal weakness.  Endo/Heme/Allergies: Does not bruise/bleed easily.    Blood pressure 125/83, pulse 70, temperature 97.4 F (36.3 C), temperature source Oral, resp. rate 20, height 5\' 1"  (1.549 m), weight 150 lb (68.04 kg), SpO2 98.00%. Physical Exam  Constitutional: She  is oriented to person, place, and time. She appears well-developed and well-nourished.  Cardiovascular: Normal rate and regular rhythm.   Respiratory: Effort normal and breath sounds normal.  GI: Soft. Bowel sounds are normal. There is no tenderness.  Musculoskeletal: Normal range of motion. She exhibits no edema.  Neurological: She is alert and oriented to person, place, and time. No cranial nerve deficit. Coordination normal.     Assessment/Plan Pt with history of  endovascularly  treated left ICA and right ICA intracranial aneurysms 2009/2010. Plan is for follow up cerebral arteriogram today to assess stability. Details/risks of procedure d/w pt with her understanding and consent.  ALLRED,D KEVIN 02/14/2012, 8:01 AM

## 2012-02-14 NOTE — Progress Notes (Signed)
UP AND WALKED AND TOL WELL; RIGHT GROIN STABLE; NO BLEEDING OR HEMATOMA; VOIDED; IV AT 75CC/HR POST PROCEDURE

## 2012-02-21 ENCOUNTER — Ambulatory Visit (HOSPITAL_COMMUNITY): Payer: BC Managed Care – PPO

## 2012-08-03 ENCOUNTER — Other Ambulatory Visit: Payer: Self-pay | Admitting: Family

## 2012-08-06 ENCOUNTER — Encounter: Payer: Self-pay | Admitting: Family

## 2012-08-06 ENCOUNTER — Ambulatory Visit (INDEPENDENT_AMBULATORY_CARE_PROVIDER_SITE_OTHER): Payer: BC Managed Care – PPO | Admitting: Family

## 2012-08-06 VITALS — BP 92/70 | HR 93 | Wt 145.0 lb

## 2012-08-06 DIAGNOSIS — I10 Essential (primary) hypertension: Secondary | ICD-10-CM

## 2012-08-06 DIAGNOSIS — H6123 Impacted cerumen, bilateral: Secondary | ICD-10-CM

## 2012-08-06 DIAGNOSIS — R42 Dizziness and giddiness: Secondary | ICD-10-CM

## 2012-08-06 DIAGNOSIS — H6120 Impacted cerumen, unspecified ear: Secondary | ICD-10-CM

## 2012-08-06 NOTE — Progress Notes (Signed)
Subjective:    Patient ID: Krystal Houston, female    DOB: April 16, 1955, 57 y.o.   MRN: [id number]  HPI   57 year old [place] American female, nonsmoker and then for recheck of hypertension. She is concerned of lightheadedness and dizziness over the past several days particularly when going from sitting to standing position. She's taken lisinopril 10 mg once daily. She's been trying to work on reducing her weight and is down 5 pounds from her last office visit.  Review of Systems  Constitutional: Negative.   Eyes: Negative.   Respiratory: Negative.   Cardiovascular: Negative.   Gastrointestinal: Negative.   Endocrine: Negative.   Genitourinary: Negative.   Musculoskeletal: Negative.   Skin: Negative.   Allergic/Immunologic: Negative.   Neurological: Positive for dizziness and light-headedness.  Hematological: Negative.   Psychiatric/Behavioral: Negative.    Past Medical History  Diagnosis Date  . Hyperlipidemia   . Hypertension   . Brain aneurysm     History   Social History  . Marital Status: Legally Separated    Spouse Name: N/A    Number of Children: N/A  . Years of Education: N/A   Occupational History  . Not on file.   Social History Main Topics  . Smoking status: Never Smoker   . Smokeless tobacco: Never Used  . Alcohol Use: No  . Drug Use: No  . Sexually Active: Not on file   Other Topics Concern  . Not on file   Social History Narrative  . No narrative on file    Past Surgical History  Procedure Laterality Date  . Cerebral aneurysm repair    . Vaginal deliveries      x 3  . Abdominal hysterectomy      Family History  Problem Relation Age of Onset  . Heart disease Mother   . Diabetes Father   . Coronary artery disease Other   . Diabetes Other     Allergies  Allergen Reactions  . Sulfamethoxazole Rash and Other (See Comments)    REACTION: vomiting    Current Outpatient Prescriptions on File Prior to Visit  Medication Sig Dispense Refill   . aspirin EC 81 MG tablet Take 81 mg by mouth daily.       No current facility-administered medications on file prior to visit.    BP 92/70  Pulse 93  Wt 145 lb (65.772 kg)  BMI 27.41 kg/m2chart    Objective:   Physical Exam  Constitutional: She is oriented to person, place, and time. She appears well-developed and well-nourished.  HENT:  Nose: Nose normal.  Mouth/Throat: Oropharynx is clear and moist.  Bilateral cerumen impaction  Neck: Normal range of motion. Neck supple. No thyromegaly present.  Cardiovascular: Normal rate, regular rhythm and normal heart sounds.   Recheck blood pressure 92/70  Pulmonary/Chest: Effort normal and breath sounds normal.  Abdominal: Soft. Bowel sounds are normal.  Musculoskeletal: Normal range of motion.  Neurological: She is alert and oriented to person, place, and time.  Skin: Skin is warm and dry.  Psychiatric: She has a normal mood and affect.        Informed consent was obtained and peroxide gel was inserted into the ears bilaterally using the lavage kit the ears were lavaged until clean.Inspection with a cerumen spoon removed residual wax. Patient tolerated the procedure well. Assessment & Plan:  Assessment: 1. Hypertension 2. Lightheadedness and dizziness 3. Cerumen impaction  Plan: Discontinue lisinopril. We'll patient will call with blood pressure readings over the next 2 weeks  taken 3 times weekly. I believe her weight reduction and dietary changes for the rationale for her reduced blood pressure and called in lightheadedness and dizziness. She also has cerumen impaction cleared today. Patient called the office clinic questions or concerns. Return for complete physical exam soon as possible and sooner as needed.

## 2012-08-14 ENCOUNTER — Other Ambulatory Visit (INDEPENDENT_AMBULATORY_CARE_PROVIDER_SITE_OTHER): Payer: BC Managed Care – PPO

## 2012-08-14 DIAGNOSIS — E785 Hyperlipidemia, unspecified: Secondary | ICD-10-CM

## 2012-08-14 DIAGNOSIS — Z0000 Encounter for general adult medical examination without abnormal findings: Secondary | ICD-10-CM

## 2012-08-14 LAB — HEPATIC FUNCTION PANEL
ALT: 12 U/L (ref 0–35)
AST: 19 U/L (ref 0–37)
Albumin: 4.2 g/dL (ref 3.5–5.2)
Alkaline Phosphatase: 46 U/L (ref 39–117)

## 2012-08-14 LAB — POCT URINALYSIS DIPSTICK
Glucose, UA: NEGATIVE
Spec Grav, UA: 1.025
Urobilinogen, UA: 1
pH, UA: 5.5

## 2012-08-14 LAB — CBC WITH DIFFERENTIAL/PLATELET
Basophils Absolute: 0 10*3/uL (ref 0.0–0.1)
Basophils Relative: 0.4 % (ref 0.0–3.0)
Eosinophils Relative: 1.1 % (ref 0.0–5.0)
HCT: 39.1 % (ref 36.0–46.0)
Hemoglobin: 12.8 g/dL (ref 12.0–15.0)
Lymphocytes Relative: 54.1 % — ABNORMAL HIGH (ref 12.0–46.0)
Lymphs Abs: 1.8 10*3/uL (ref 0.7–4.0)
Monocytes Relative: 7.5 % (ref 3.0–12.0)
Neutro Abs: 1.2 10*3/uL — ABNORMAL LOW (ref 1.4–7.7)
RBC: 4.85 Mil/uL (ref 3.87–5.11)
RDW: 14.5 % (ref 11.5–14.6)
WBC: 3.2 10*3/uL — ABNORMAL LOW (ref 4.5–10.5)

## 2012-08-14 LAB — LIPID PANEL
HDL: 53.3 mg/dL (ref >39.00)
Total CHOL/HDL Ratio: 4
Triglycerides: 78 mg/dL (ref 0.0–149.0)

## 2012-08-14 LAB — BASIC METABOLIC PANEL
Calcium: 9.2 mg/dL (ref 8.4–10.5)
GFR: 94.97 mL/min (ref >60.00)
Glucose, Bld: 74 mg/dL (ref 70–99)
Potassium: 4.2 mEq/L (ref 3.5–5.1)
Sodium: 140 mEq/L (ref 135–145)

## 2012-08-14 LAB — TSH: TSH: 0.36 u[IU]/mL (ref 0.35–5.50)

## 2012-08-15 LAB — LDL CHOLESTEROL, DIRECT: Direct LDL: 140.4 mg/dL

## 2012-08-29 ENCOUNTER — Other Ambulatory Visit: Payer: Self-pay | Admitting: Family

## 2012-08-30 ENCOUNTER — Ambulatory Visit (INDEPENDENT_AMBULATORY_CARE_PROVIDER_SITE_OTHER): Payer: BC Managed Care – PPO | Admitting: Family

## 2012-08-30 ENCOUNTER — Encounter: Payer: Self-pay | Admitting: Family

## 2012-08-30 VITALS — BP 120/80 | HR 55 | Ht 61.0 in | Wt 143.0 lb

## 2012-08-30 DIAGNOSIS — Z0000 Encounter for general adult medical examination without abnormal findings: Secondary | ICD-10-CM

## 2012-08-30 DIAGNOSIS — Z23 Encounter for immunization: Secondary | ICD-10-CM

## 2012-08-30 DIAGNOSIS — Z1231 Encounter for screening mammogram for malignant neoplasm of breast: Secondary | ICD-10-CM

## 2012-08-30 NOTE — Patient Instructions (Addendum)
Breast Self-Examination You should begin examining your breasts at age 57 even though the risk for breast cancer is low in this age group. It is important to become familiar with how your breasts look and feel. This is true for pregnant women, nursing mothers, women in menopause and women who have breast implants.  Women should examine their breasts once a month to look for changes and lumps. By doing monthly breast exams, you get to know how your breasts feel and how they can change from month to month. This allows you to pick up changes early. It can also offer you some reassurance that your breast health is good. This exam only takes minutes. Most breast lumps are not caused by cancer. If you find a lump, a special x-ray called a mammogram, or other tests may be needed to determine what is wrong.  Some of the signs that a breast lump is caused by cancer include:  Dimpling of the skin or changes in the shape of the breast or nipple.   A dark-colored or bloody discharge from the nipple.   Swollen lymph glands around the breast or in the armpit.   Redness of the breast or nipple.   Scaly nipple or skin on the breast.   Pain or swelling of the breast.  SELF-EXAM There are a few points to follow when doing a thorough breast exam. The best time to examine your breasts is 5 to 7 days after the menstrual period is over. During menstruation, the breasts are lumpier, and it may be more difficult to pick up changes. If you do not menstruate, have reached menopause or had a hysterectomy, examine your breasts the first day of every month. After three to four months, you will become more familiar with the variations of your breasts and more comfortable with the exam.  Perform your breast exam monthly. Keep a written record with breast changes or normal findings for each breast. This makes it easier to be sure of changes and to not solely depend on memory for size, tenderness, or location. Try to do the exam  at the same time each month, and write down where you are in your menstrual cycle if you are still menstruating.   Look at your breasts. Stand in front of a mirror with your hands clasped behind your head. Tighten your chest muscles and look for asymmetry. This means a difference in shape or contour from one breast to the other, such as puckers, dips or bumps. Look also for skin changes.   Lean forward with your hands on your hips. Again, look for symmetry and skin changes.   While showering, soap the breasts, and carefully feel the breasts with fingertips while holding the arm (on the side of the breast being examined) over the head. Do this with each breast carefully feeling for lumps or changes. Typically, a circular motion with moderate fingertip pressure should be used.   Repeat this exam while lying on your back, again with your arm behind your head and a pillow under your shoulders. Again, use your fingertips to examine both breasts, feeling for lumps and thickening. Begin at 1 o'clock and go clockwise around the whole breast.   At the end of your exam, gently squeeze each nipple to see if there is any drainage. Look for nipple changes, dimpling or redness.   Lastly, examine the upper chest and clavicle areas and in your armpits.  It is not necessary to become alarmed if you find  a breast lump. Most of them are not cancerous. However, it is necessary to see your caregiver if a lump is found in order to have it looked at. Document Released: 01/27/2004 Document Revised: 08/31/2010 Document Reviewed: 04/07/2008 [facility] Patient Information 2012 [place], [place].    Fat and Cholesterol Control Diet Cholesterol levels in your body are determined significantly by your diet. Cholesterol levels may also be related to heart disease. The following material helps to explain this relationship and discusses what you can do to help keep your heart healthy. Not all cholesterol is bad. Low-density  lipoprotein (LDL) cholesterol is the "bad" cholesterol. It may cause fatty deposits to build up inside your arteries. High-density lipoprotein (HDL) cholesterol is "good." It helps to remove the "bad" LDL cholesterol from your blood. Cholesterol is a very important risk factor for heart disease. Other risk factors are high blood pressure, smoking, stress, heredity, and weight. The heart muscle gets its supply of blood through the coronary arteries. If your LDL cholesterol is high and your HDL cholesterol is low, you are at risk for having fatty deposits build up in your coronary arteries. This leaves less room through which blood can flow. Without sufficient blood and oxygen, the heart muscle cannot function properly and you may feel chest pains (angina pectoris). When a coronary artery closes up entirely, a part of the heart muscle may die causing a heart attack (myocardial infarction). CHECKING CHOLESTEROL When your caregiver sends your blood to a lab to be examined for cholesterol, a complete lipid (fat) profile may be done. With this test, the total amount of cholesterol and levels of LDL and HDL are determined. Triglycerides are a type of fat that circulates in the blood. They can also be used to determine heart disease risk. The list below describes what the numbers should be: Test: Total Cholesterol.  Less than 200 mg/dl. Test: LDL "bad cholesterol."  Less than 100 mg/dl.  Less than 70 mg/dl if you are at very high risk of a heart attack or sudden cardiac death. Test: HDL "good cholesterol."  Greater than 50 mg/dl for women.  Greater than 40 mg/dl for men. Test: Triglycerides.  Less than 150 mg/dl. CONTROLLING CHOLESTEROL WITH DIET Although exercise and lifestyle factors are important, your diet is key. That is because certain foods are known to raise cholesterol and others to lower it. The goal is to balance foods for their effect on cholesterol and more importantly, to replace saturated  and trans fat with other types of fat, such as monounsaturated fat, polyunsaturated fat, and omega-3 fatty acids. On average, a person should consume no more than 15 to 17 g of saturated fat daily. Saturated and trans fats are considered "bad" fats, and they will raise LDL cholesterol. Saturated fats are primarily found in animal products such as meats, butter, and cream. However, that does not mean you need to give up all your favorite foods. Today, there are good tasting, low-fat, low-cholesterol substitutes for most of the things you like to eat. Choose low-fat or nonfat alternatives. Choose round or loin cuts of red meat. These types of cuts are lowest in fat and cholesterol. Chicken (without the skin), fish, veal, and ground [place] breast are great choices. Eliminate fatty meats, such as hot dogs and salami. Even shellfish have little or no saturated fat. Have a 3 oz (85 g) portion when you eat lean meat, poultry, or fish. Trans fats are also called "partially hydrogenated oils." They are oils that have been scientifically  manipulated so that they are solid at room temperature resulting in a longer shelf life and improved taste and texture of foods in which they are added. Trans fats are found in stick margarine, some tub margarines, cookies, crackers, and baked goods.  When baking and cooking, oils are a great substitute for butter. The monounsaturated oils are especially beneficial since it is believed they lower LDL and raise HDL. The oils you should avoid entirely are saturated tropical oils, such as coconut and palm.  Remember to eat a lot from food groups that are naturally free of saturated and trans fat, including fish, fruit, vegetables, beans, grains (barley, rice, couscous, bulgur wheat), and pasta (without cream sauces).  IDENTIFYING FOODS THAT LOWER CHOLESTEROL  Soluble fiber may lower your cholesterol. This type of fiber is found in fruits such as apples, vegetables such as broccoli,  potatoes, and carrots, legumes such as beans, peas, and lentils, and grains such as barley. Foods fortified with plant sterols (phytosterol) may also lower cholesterol. You should eat at least 2 g per day of these foods for a cholesterol lowering effect.  Read package labels to identify low-saturated fats, trans fat free, and low-fat foods at the supermarket. Select cheeses that have only 2 to 3 g saturated fat per ounce. Use a heart-healthy tub margarine that is free of trans fats or partially hydrogenated oil. When buying baked goods (cookies, crackers), avoid partially hydrogenated oils. Breads and muffins should be made from whole grains (whole-wheat or whole oat flour, instead of "flour" or "enriched flour"). Buy non-creamy canned soups with reduced salt and no added fats.  FOOD PREPARATION TECHNIQUES  Never deep-fry. If you must fry, either stir-fry, which uses very little fat, or use non-stick cooking sprays. When possible, broil, bake, or roast meats, and steam vegetables. Instead of putting butter or margarine on vegetables, use lemon and herbs, applesauce, and cinnamon (for squash and sweet potatoes), nonfat yogurt, salsa, and low-fat dressings for salads.  LOW-SATURATED FAT / LOW-FAT FOOD SUBSTITUTES Meats / Saturated Fat (g)  Avoid: Steak, marbled (3 oz/85 g) / 11 g  Choose: Steak, lean (3 oz/85 g) / 4 g  Avoid: Hamburger (3 oz/85 g) / 7 g  Choose: Hamburger, lean (3 oz/85 g) / 5 g  Avoid: Ham (3 oz/85 g) / 6 g  Choose: Ham, lean cut (3 oz/85 g) / 2.4 g  Avoid: Chicken, with skin, dark meat (3 oz/85 g) / 4 g  Choose: Chicken, skin removed, dark meat (3 oz/85 g) / 2 g  Avoid: Chicken, with skin, light meat (3 oz/85 g) / 2.5 g  Choose: Chicken, skin removed, light meat (3 oz/85 g) / 1 g Dairy / Saturated Fat (g)  Avoid: Whole milk (1 cup) / 5 g  Choose: Low-fat milk, 2% (1 cup) / 3 g  Choose: Low-fat milk, 1% (1 cup) / 1.5 g  Choose: Skim milk (1 cup) / 0.3 g  Avoid:  Hard cheese (1 oz/28 g) / 6 g  Choose: Skim milk cheese (1 oz/28 g) / 2 to 3 g  Avoid: Cottage cheese, 4% fat (1 cup) / 6.5 g  Choose: Low-fat cottage cheese, 1% fat (1 cup) / 1.5 g  Avoid: Ice cream (1 cup) / 9 g  Choose: Sherbet (1 cup) / 2.5 g  Choose: Nonfat frozen yogurt (1 cup) / 0.3 g  Choose: Frozen fruit bar / trace  Avoid: Whipped cream (1 tbs) / 3.5 g  Choose: Nondairy whipped topping (1 tbs) /  1 g Condiments / Saturated Fat (g)  Avoid: Mayonnaise (1 tbs) / 2 g  Choose: Low-fat mayonnaise (1 tbs) / 1 g  Avoid: Butter (1 tbs) / 7 g  Choose: Extra light margarine (1 tbs) / 1 g  Avoid: Coconut oil (1 tbs) / 11.8 g  Choose: Olive oil (1 tbs) / 1.8 g  Choose: Corn oil (1 tbs) / 1.7 g  Choose: Safflower oil (1 tbs) / 1.2 g  Choose: Sunflower oil (1 tbs) / 1.4 g  Choose: Soybean oil (1 tbs) / 2.4 g  Choose: Canola oil (1 tbs) / 1 g Document Released: 12/19/2004 Document Revised: 03/13/2011 Document Reviewed: 06/09/2010 [employer] Patient Information 2014 [place], [place].

## 2012-08-30 NOTE — Progress Notes (Signed)
Subjective:    Patient ID: Krystal Houston, female    DOB: [birth year], 57 y.o.   MRN: [id number]  HPI 57 year old female, nonsmoker, is in for a Physical examination for this healthy  Female. Reviewed all health maintenance protocols including mammography colonoscopy bone density and reviewed appropriate screening labs. Her immunization history was reviewed as well as her current medications and allergies refills of her chronic medications were given and the plan for yearly health maintenance was discussed all orders and referrals were made as appropriate.   Review of Systems  Constitutional: Negative.   HENT: Negative.   Eyes: Negative.   Respiratory: Negative.   Cardiovascular: Negative.   Gastrointestinal: Negative.   Endocrine: Negative.   Genitourinary: Negative.   Musculoskeletal: Negative.   Skin: Negative.   Allergic/Immunologic: Negative.   Neurological: Negative.   Hematological: Negative.   Psychiatric/Behavioral: Negative.    Past Medical History  Diagnosis Date  . Hyperlipidemia   . Hypertension   . Brain aneurysm     History   Social History  . Marital Status: Legally Separated    Spouse Name: N/A    Number of Children: N/A  . Years of Education: N/A   Occupational History  . Not on file.   Social History Main Topics  . Smoking status: Never Smoker   . Smokeless tobacco: Never Used  . Alcohol Use: No  . Drug Use: No  . Sexual Activity: Not on file   Other Topics Concern  . Not on file   Social History Narrative  . No narrative on file    Past Surgical History  Procedure Laterality Date  . Cerebral aneurysm repair    . Vaginal deliveries      x 3  . Abdominal hysterectomy      Family History  Problem Relation Age of Onset  . Heart disease Mother   . Diabetes Father   . Coronary artery disease Other   . Diabetes Other     Allergies  Allergen Reactions  . Sulfamethoxazole Rash and Other (See Comments)    REACTION: vomiting     Current Outpatient Prescriptions on File Prior to Visit  Medication Sig Dispense Refill  . aspirin EC 81 MG tablet Take 81 mg by mouth daily.       No current facility-administered medications on file prior to visit.    BP 120/80  Pulse 55  Ht 5\' 1"  (1.549 m)  Wt 143 lb (64.864 kg)  BMI 27.03 kg/m2chart      Objective:   Physical Exam  Constitutional: She is oriented to person, place, and time. She appears well-developed and well-nourished.  HENT:  Head: Normocephalic.  Right Ear: External ear normal.  Left Ear: External ear normal.  Nose: Nose normal.  Mouth/Throat: Oropharynx is clear and moist.  Eyes: Conjunctivae and EOM are normal. Pupils are equal, round, and reactive to light.  Neck: Normal range of motion. Neck supple.  Cardiovascular: Normal rate, regular rhythm and normal heart sounds.   Pulmonary/Chest: Effort normal and breath sounds normal.  Abdominal: Soft. Bowel sounds are normal.  Musculoskeletal: Normal range of motion.  Neurological: She is alert and oriented to person, place, and time. She has normal reflexes.  Skin: Skin is warm and dry.  Psychiatric: She has a normal mood and affect.          Assessment & Plan:  Assessment:  1. CPX 2. Hypertension 3. Hyperlipidemia  Plan: Encouraged a healthy diet, exercise, month self breast exams.  Refer for colonoscopy screening. Mammogram and bone density ordered. Recheck in 1 year and as needed sooner.

## 2012-10-08 ENCOUNTER — Ambulatory Visit: Payer: BC Managed Care – PPO

## 2012-10-08 ENCOUNTER — Other Ambulatory Visit: Payer: BC Managed Care – PPO

## 2012-10-18 ENCOUNTER — Encounter: Payer: Self-pay | Admitting: Family

## 2013-05-03 ENCOUNTER — Other Ambulatory Visit: Payer: Self-pay | Admitting: Family

## 2013-05-05 ENCOUNTER — Encounter: Payer: Self-pay | Admitting: Family

## 2013-05-05 ENCOUNTER — Telehealth: Payer: Self-pay | Admitting: Internal Medicine

## 2013-05-05 ENCOUNTER — Ambulatory Visit (INDEPENDENT_AMBULATORY_CARE_PROVIDER_SITE_OTHER): Payer: BC Managed Care – PPO | Admitting: Family

## 2013-05-05 VITALS — BP 120/94 | HR 74 | Temp 98.3°F | Wt 150.0 lb

## 2013-05-05 DIAGNOSIS — I671 Cerebral aneurysm, nonruptured: Secondary | ICD-10-CM

## 2013-05-05 DIAGNOSIS — I10 Essential (primary) hypertension: Secondary | ICD-10-CM

## 2013-05-05 MED ORDER — LISINOPRIL 10 MG PO TABS: ORAL_TABLET | ORAL | Status: DC

## 2013-05-05 NOTE — Telephone Encounter (Signed)
Relevant patient education mailed to patient.  

## 2013-05-05 NOTE — Progress Notes (Signed)
Pre visit review using our clinic review tool, if applicable. No additional management support is needed unless otherwise documented below in the visit note. 

## 2013-05-05 NOTE — Patient Instructions (Signed)
Sodium-Controlled Diet Sodium is a mineral. It is found in many foods. Sodium may be found naturally or added during the making of a food. The most common form of sodium is salt, which is made up of sodium and chloride. Reducing your sodium intake involves changing your eating habits. The following guidelines will help you reduce the sodium in your diet:  Stop using the salt shaker.  Use salt sparingly in cooking and baking.  Substitute with sodium-free seasonings and spices.  Do not use a salt substitute (potassium chloride) without your caregiver's permission.  Include a variety of fresh, unprocessed foods in your diet.  Limit the use of processed and convenience foods that are high in sodium. USE THE FOLLOWING FOODS SPARINGLY: Breads/Starches  Commercial bread stuffing, commercial pancake or waffle mixes, coating mixes. Waffles. Croutons. Prepared (boxed or frozen) potato, rice, or noodle mixes that contain salt or sodium. Salted French fries or hash browns. Salted popcorn, breads, crackers, chips, or snack foods. Vegetables  Vegetables canned with salt or prepared in cream, butter, or cheese sauces. Sauerkraut. Tomato or vegetable juices canned with salt.  Fresh vegetables are allowed if rinsed thoroughly. Fruit  Fruit is okay to eat. Meat and Meat Substitutes  Salted or smoked meats, such as bacon or Canadian bacon, chipped or corned beef, hot dogs, salt pork, luncheon meats, pastrami, ham, or sausage. Canned or smoked fish, poultry, or meat. Processed cheese or cheese spreads, blue or [place]ef[place] cheese. Battered or frozen fish products. Prepared spaghetti sauce. Baked beans. Reuben sandwiches. Salted nuts. Caviar. Milk  Limit buttermilk to 1 cup per week. Soups and Combination Foods  Bouillon cubes, canned or dried soups, broth, consomm. Convenience (frozen or packaged) dinners with more than 600 mg sodium. Pot pies, pizza, Asian food, fast food cheeseburgers, and specialty  sandwiches. Desserts and Sweets  Regular (salted) desserts, pie, commercial fruit snack pies, commercial snack cakes, canned puddings.  Eat desserts and sweets in moderation. Fats and Oils  Gravy mixes or canned gravy. No more than 1 to 2 tbs of salad dressing. Chip dips.  Eat fats and oils in moderation. Beverages  See those listed under the vegetables and milk groups. Condiments  Ketchup, mustard, meat sauces, salsa, regular (salted) and lite soy sauce or mustard. Dill pickles, olives, meat tenderizer. Prepared horseradish or pickle relish. Dutch-processed cocoa. Baking powder or baking soda used medicinally. Worcestershire sauce. "Light" salt. Salt substitute, unless approved by your caregiver. Document Released: 06/10/2001 Document Revised: 03/13/2011 Document Reviewed: 01/11/2009 [employer] Patient Information 2014 [employer].  

## 2013-05-05 NOTE — Progress Notes (Signed)
   Subjective:    Patient ID: Krystal Houston, female    DOB: 12/07/1955, 58 y.o.   MRN: [id number]  HPI  58 year old [place] female, nonsmoker presenting today for left eye and headache x 2 weeks. She has been out of her blood pressure medication for four weeks.  She is concerned because of history aneurysm in the past.  She had angiogram last year that was stable.    Review of Systems  Constitutional: Negative.   HENT: Negative for sinus pressure.   Eyes: Negative.   Respiratory: Negative.   Cardiovascular: Negative.   Endocrine: Negative.   Genitourinary: Negative.   Musculoskeletal: Negative.   Neurological: Positive for headaches. Negative for dizziness, weakness and light-headedness.  Psychiatric/Behavioral: Negative.    Past Medical History  Diagnosis Date  . Hyperlipidemia   . Hypertension   . Brain aneurysm     History   Social History  . Marital Status: Legally Separated    Spouse Name: N/A    Number of Children: N/A  . Years of Education: N/A   Occupational History  . Not on file.   Social History Main Topics  . Smoking status: Never Smoker   . Smokeless tobacco: Never Used  . Alcohol Use: No  . Drug Use: No  . Sexual Activity: Not on file   Other Topics Concern  . Not on file   Social History Narrative  . No narrative on file    Past Surgical History  Procedure Laterality Date  . Cerebral aneurysm repair    . Vaginal deliveries      x 3  . Abdominal hysterectomy      Family History  Problem Relation Age of Onset  . Heart disease Mother   . Diabetes Father   . Coronary artery disease Other   . Diabetes Other     Allergies  Allergen Reactions  . Sulfamethoxazole Rash and Other (See Comments)    REACTION: vomiting    Current Outpatient Prescriptions on File Prior to Visit  Medication Sig Dispense Refill  . aspirin EC 81 MG tablet Take 81 mg by mouth daily.       No current facility-administered medications on file prior to visit.     BP 120/94  Pulse 74  Temp(Src) 98.3 F (36.8 C) (Oral)  Wt 150 lb (68.04 kg)  SpO2 97%chart     Objective:   Physical Exam  Constitutional: She is oriented to person, place, and time. She appears well-developed and well-nourished.  HENT:  Head: Normocephalic.  Right Ear: External ear normal.  Left Ear: External ear normal.  Nose: Nose normal.  Mouth/Throat: Oropharynx is clear and moist.  Eyes: Conjunctivae and EOM are normal. Pupils are equal, round, and reactive to light.  Neck: Normal range of motion. Neck supple.  Cardiovascular: Normal rate, regular rhythm and normal heart sounds.   Pulmonary/Chest: Effort normal and breath sounds normal.  Abdominal: Soft. Bowel sounds are normal.  Musculoskeletal: Normal range of motion.  Neurological: She is alert and oriented to person, place, and time. She has normal reflexes. No cranial nerve deficit.  Skin: Skin is warm.  Psychiatric: She has a normal mood and affect.          Assessment & Plan:  Assessment 1. Uncontrolled hypertension 2. Cerebral Aneurysm-stable   Plan 1. Prescription sent to pharmacy.  2 Reduce sodium intake.  3.Contact office for questions or concerns. Encouraged medication compliance.

## 2013-06-04 ENCOUNTER — Ambulatory Visit (INDEPENDENT_AMBULATORY_CARE_PROVIDER_SITE_OTHER): Payer: BC Managed Care – PPO | Admitting: Family

## 2013-06-04 ENCOUNTER — Encounter: Payer: Self-pay | Admitting: Family

## 2013-06-04 VITALS — BP 146/90 | HR 72 | Temp 98.7°F | Ht 62.0 in | Wt 149.0 lb

## 2013-06-04 DIAGNOSIS — I10 Essential (primary) hypertension: Secondary | ICD-10-CM

## 2013-06-04 DIAGNOSIS — Z0000 Encounter for general adult medical examination without abnormal findings: Secondary | ICD-10-CM

## 2013-06-04 LAB — COMPREHENSIVE METABOLIC PANEL
ALK PHOS: 49 U/L (ref 39–117)
ALT: 18 U/L (ref 0–35)
AST: 21 U/L (ref 0–37)
Albumin: 4.2 g/dL (ref 3.5–5.2)
BILIRUBIN TOTAL: 0.6 mg/dL (ref 0.2–1.2)
BUN: 13 mg/dL (ref 6–23)
CO2: 27 meq/L (ref 19–32)
Calcium: 9.2 mg/dL (ref 8.4–10.5)
Chloride: 110 mEq/L (ref 96–112)
Creatinine, Ser: 0.8 mg/dL (ref 0.4–1.2)
GFR: 100.48 mL/min (ref >60.00)
GLUCOSE: 86 mg/dL (ref 70–99)
Potassium: 4.1 mEq/L (ref 3.5–5.1)
Sodium: 143 mEq/L (ref 135–145)
TOTAL PROTEIN: 7.5 g/dL (ref 6.0–8.3)

## 2013-06-04 LAB — CBC WITH DIFFERENTIAL/PLATELET
BASOS PCT: 0.5 % (ref 0.0–3.0)
Basophils Absolute: 0 10*3/uL (ref 0.0–0.1)
EOS PCT: 0.8 % (ref 0.0–5.0)
Eosinophils Absolute: 0 10*3/uL (ref 0.0–0.7)
HEMATOCRIT: 39.3 % (ref 36.0–46.0)
HEMOGLOBIN: 12.8 g/dL (ref 12.0–15.0)
LYMPHS ABS: 1.7 10*3/uL (ref 0.7–4.0)
Lymphocytes Relative: 48.4 % — ABNORMAL HIGH (ref 12.0–46.0)
MCHC: 32.5 g/dL (ref 30.0–36.0)
MCV: 81.1 fl (ref 78.0–100.0)
MONO ABS: 0.3 10*3/uL (ref 0.1–1.0)
MONOS PCT: 9.3 % (ref 3.0–12.0)
NEUTROS ABS: 1.5 10*3/uL (ref 1.4–7.7)
Neutrophils Relative %: 41 % — ABNORMAL LOW (ref 43.0–77.0)
Platelets: 267 10*3/uL (ref 150.0–400.0)
RBC: 4.85 Mil/uL (ref 3.87–5.11)
RDW: 13.9 % (ref 11.5–15.5)
WBC: 3.6 10*3/uL — AB (ref 4.0–10.5)

## 2013-06-04 LAB — LIPID PANEL
CHOLESTEROL: 223 mg/dL — AB (ref 0–200)
HDL: 54.9 mg/dL (ref >39.00)
LDL Cholesterol: 154 mg/dL — ABNORMAL HIGH (ref 0–99)
NONHDL: 168.1
Total CHOL/HDL Ratio: 4
Triglycerides: 71 mg/dL (ref 0.0–149.0)
VLDL: 14.2 mg/dL (ref 0.0–40.0)

## 2013-06-04 LAB — POCT URINALYSIS DIPSTICK
Bilirubin, UA: NEGATIVE
Glucose, UA: NEGATIVE
Ketones, UA: NEGATIVE
Leukocytes, UA: NEGATIVE
Nitrite, UA: NEGATIVE
PROTEIN UA: NEGATIVE
UROBILINOGEN UA: 0.2
pH, UA: 5.5

## 2013-06-04 LAB — TSH: TSH: 0.54 u[IU]/mL (ref 0.35–4.50)

## 2013-06-04 NOTE — Patient Instructions (Signed)
Please schedule your mammogram.  Breast Self-Awareness Practicing breast self-awareness may pick up problems early, prevent significant medical complications, and possibly save your life. By practicing breast self-awareness, you can become familiar with how your breasts look and feel and if your breasts are changing. This allows you to notice changes early. It can also offer you some reassurance that your breast health is good. One way to learn what is normal for your breasts and whether your breasts are changing is to do a breast self-exam. If you find a lump or something that was not present in the past, it is best to contact your caregiver right away. Other findings that should be evaluated by your caregiver include nipple discharge, especially if it is bloody; skin changes or reddening; areas where the skin seems to be pulled in (retracted); or new lumps and bumps. Breast pain is seldom associated with cancer (malignancy), but should also be evaluated by a caregiver. HOW TO PERFORM A BREAST SELF-EXAM The best time to examine your breasts is 5 7 days after your menstrual period is over. During menstruation, the breasts are lumpier, and it may be more difficult to pick up changes. If you do not menstruate, have reached menopause, or had your uterus removed (hysterectomy), you should examine your breasts at regular intervals, such as monthly. If you are breastfeeding, examine your breasts after a feeding or after using a breast pump. Breast implants do not decrease the risk for lumps or tumors, so continue to perform breast self-exams as recommended. Talk to your caregiver about how to determine the difference between the implant and breast tissue. Also, talk about the amount of pressure you should use during the exam. Over time, you will become more familiar with the variations of your breasts and more comfortable with the exam. A breast self-exam requires you to remove all your clothes above the  waist. 1. Look at your breasts and nipples. Stand in front of a mirror in a room with good lighting. With your hands on your hips, push your hands firmly downward. Look for a difference in shape, contour, and size from one breast to the other (asymmetry). Asymmetry includes puckers, dips, or bumps. Also, look for skin changes, such as reddened or scaly areas on the breasts. Look for nipple changes, such as discharge, dimpling, repositioning, or redness. 2. Carefully feel your breasts. This is best done either in the shower or tub while using soapy water or when flat on your back. Place the arm (on the side of the breast you are examining) above your head. Use the pads (not the fingertips) of your three middle fingers on your opposite hand to feel your breasts. Start in the underarm area and use  inch (2 cm) overlapping circles to feel your breast. Use 3 different levels of pressure (light, medium, and firm pressure) at each circle before moving to the next circle. The light pressure is needed to feel the tissue closest to the skin. The medium pressure will help to feel breast tissue a little deeper, while the firm pressure is needed to feel the tissue close to the ribs. Continue the overlapping circles, moving downward over the breast until you feel your ribs below your breast. Then, move one finger-width towards the center of the body. Continue to use the  inch (2 cm) overlapping circles to feel your breast as you move slowly up toward the collar bone (clavicle) near the base of the neck. Continue the up and down exam using all  3 pressures until you reach the middle of the chest. Do this with each breast, carefully feeling for lumps or changes. 3.  Keep a written record with breast changes or normal findings for each breast. By writing this information down, you do not need to depend only on memory for size, tenderness, or location. Write down where you are in your menstrual cycle, if you are still  menstruating. Breast tissue can have some lumps or thick tissue. However, see your caregiver if you find anything that concerns you.  SEEK MEDICAL CARE IF:  You see a change in shape, contour, or size of your breasts or nipples.   You see skin changes, such as reddened or scaly areas on the breasts or nipples.   You have an unusual discharge from your nipples.   You feel a new lump or unusually thick areas.  Document Released: 12/19/2004 Document Revised: 12/06/2011 Document Reviewed: 04/05/2011 [facility] Patient Information 2014 [place].

## 2013-06-04 NOTE — Progress Notes (Signed)
Subjective:    Patient ID: Krystal Houston, female    DOB: 26-May-1955, 58 y.o.   MRN: [id number]  HPI  58 year old [place] American female presents today for annual physical exam. She has no acute issues and denies pain at this time. Her PMH is significant for hypertension and cerebral aneurysm. She is compliant on current medications, lisinopril 10 mg and low dose ASA. She occasionally checks her blood pressure at home, reporting systolics in 756E. Today she is fasting and has not taken her medications. She specifically denies dry cough or adverse side effects to medications.  Her exercise consists of conditioning exercises several times per week. She is not on a modified heart health diet at this time.   Review of Systems  Constitutional: Negative.   HENT: Negative.   Eyes: Negative.   Respiratory: Negative.   Cardiovascular: Negative.   Gastrointestinal: Negative.   Endocrine: Negative.   Genitourinary: Negative.        GYN: She is followed by Dr. Mancel Bale at [facility].   Musculoskeletal: Negative.   Skin: Negative.   Allergic/Immunologic: Negative.   Neurological: Negative.   Hematological: Negative.   Psychiatric/Behavioral: Negative.    Past Medical History  Diagnosis Date  . Hyperlipidemia   . Hypertension   . Brain aneurysm     History   Social History  . Marital Status: Legally Separated    Spouse Name: N/A    Number of Children: N/A  . Years of Education: N/A   Occupational History  . Not on file.   Social History Main Topics  . Smoking status: Never Smoker   . Smokeless tobacco: Never Used  . Alcohol Use: No  . Drug Use: No  . Sexual Activity: Not on file   Other Topics Concern  . Not on file   Social History Narrative  . No narrative on file    Past Surgical History  Procedure Laterality Date  . Cerebral aneurysm repair    . Vaginal deliveries      x 3  . Abdominal hysterectomy      Family History  Problem Relation Age of Onset  . Heart  disease Mother   . Diabetes Father   . Coronary artery disease Other   . Diabetes Other     Allergies  Allergen Reactions  . Sulfamethoxazole Rash and Other (See Comments)    REACTION: vomiting    Current Outpatient Prescriptions on File Prior to Visit  Medication Sig Dispense Refill  . aspirin EC 81 MG tablet Take 81 mg by mouth daily.      Marland Kitchen lisinopril (PRINIVIL,ZESTRIL) 10 MG tablet TAKE 1 TABLET (10 MG TOTAL) BY MOUTH DAILY.  90 tablet  1   No current facility-administered medications on file prior to visit.    BP 146/90  Pulse 72  Temp(Src) 98.7 F (37.1 C) (Oral)  Ht 5\' 2"  (1.575 m)  Wt 149 lb (67.586 kg)  BMI 27.25 kg/m2      Objective:   Physical Exam  Constitutional: She is oriented to person, place, and time. She appears well-developed and well-nourished. No distress.  HENT:  Head: Normocephalic and atraumatic.  Eyes: Conjunctivae and EOM are normal. Pupils are equal, round, and reactive to light. No scleral icterus.  Neck: Normal range of motion. Neck supple. No JVD present. No thyromegaly present.  Cardiovascular: Normal rate, regular rhythm, normal heart sounds and intact distal pulses.  Exam reveals no gallop and no friction rub.   No murmur heard.  Pulmonary/Chest: Effort normal and breath sounds normal.  Abdominal: Soft. Bowel sounds are normal.  Musculoskeletal: Normal range of motion. She exhibits no edema and no tenderness.  Lymphadenopathy:    She has no cervical adenopathy.  Neurological: She is alert and oriented to person, place, and time. She has normal reflexes. No cranial nerve deficit. She exhibits normal muscle tone. Coordination normal.  Skin: Skin is warm.  Psychiatric: She has a normal mood and affect. Her behavior is normal. Judgment and thought content normal.         Assessment & Plan:  Deion was seen today for annual exam.  Diagnoses and associated orders for this visit:  Unspecified essential hypertension - CMP - Cancel:  Urinalysis  Wellness examination - Lipid Panel - CBC with Differential - CMP - Cancel: Urinalysis - TSH - POCT urinalysis dipstick   Call the office with any questions or concerns. Recheck in 6 months and sooner as needed.

## 2013-06-04 NOTE — Progress Notes (Signed)
Pre visit review using our clinic review tool, if applicable. No additional management support is needed unless otherwise documented below in the visit note. 

## 2013-06-05 ENCOUNTER — Telehealth: Payer: Self-pay

## 2013-06-05 MED ORDER — SIMVASTATIN 20 MG PO TABS: 20.0000 mg | ORAL_TABLET | Freq: Every day | ORAL | Status: DC

## 2013-06-05 NOTE — Telephone Encounter (Signed)
Pt would like medication.   Per Padonda, simvastatin 20mg  qd  rx sent

## 2013-06-05 NOTE — Telephone Encounter (Signed)
Message copied by Santiago Bumpers on Thu Jun 05, 2013  9:41 AM ------      Message from: Roxy Cedar B      Created: Wed Jun 04, 2013  3:26 PM       Consider medication to help reduce cholesterol. ------

## 2013-08-18 ENCOUNTER — Telehealth: Payer: Self-pay | Admitting: Internal Medicine

## 2013-08-18 NOTE — Telephone Encounter (Signed)
Caller: [name]/Patient; Phone: [phone]; Reason for Call: Reports she would like to reschedule the mole removal procedure earlier than 08/26/13 if possible.  She has hit the mole and it caused pain to the back of the right leg.  Please contact patient to reschedule procedure.

## 2013-08-18 NOTE — Telephone Encounter (Signed)
appt with [facility]

## 2013-08-26 ENCOUNTER — Ambulatory Visit (INDEPENDENT_AMBULATORY_CARE_PROVIDER_SITE_OTHER): Payer: BC Managed Care – PPO | Admitting: Family

## 2013-08-26 ENCOUNTER — Other Ambulatory Visit: Payer: Self-pay | Admitting: Family

## 2013-08-26 VITALS — BP 112/78 | HR 102 | Temp 98.3°F | Wt 147.0 lb

## 2013-08-26 DIAGNOSIS — D489 Neoplasm of uncertain behavior, unspecified: Secondary | ICD-10-CM

## 2013-08-26 NOTE — Progress Notes (Signed)
Pre visit review using our clinic review tool, if applicable. No additional management support is needed unless otherwise documented below in the visit note. 

## 2013-08-26 NOTE — Patient Instructions (Signed)
Biopsy Care After Refer to this sheet in the next few weeks. These instructions provide you with information on caring for yourself after your procedure. Your caregiver may also give you more specific instructions. Your treatment has been planned according to current medical practices, but problems sometimes occur. Call your caregiver if you have any problems or questions after your procedure. If you had a fine needle biopsy, you may have soreness at the biopsy site for 1 to 2 days. If you had an open biopsy, you may have soreness at the biopsy site for 3 to 4 days. HOME CARE INSTRUCTIONS   You may resume normal diet and activities as directed.  Change bandages (dressings) as directed. If your wound was closed with a skin glue (adhesive), it will wear off and begin to peel in 7 days.  Only take over-the-counter or prescription medicines for pain, discomfort, or fever as directed by your caregiver.  Ask your caregiver when you can bathe and get your wound wet. SEEK IMMEDIATE MEDICAL CARE IF:   You have increased bleeding (more than a small spot) from the biopsy site.  You notice redness, swelling, or increasing pain at the biopsy site.  You have pus coming from the biopsy site.  You have a fever.  You notice a bad smell coming from the biopsy site or dressing.  You have a rash, have difficulty breathing, or have any allergic problems. MAKE SURE YOU:   Understand these instructions.  Will watch your condition.  Will get help right away if you are not doing well or get worse. Document Released: 07/08/2004 Document Revised: 03/13/2011 Document Reviewed: 06/16/2010 [employer] Patient Information 2015 [employer]. This information is not intended to replace advice given to you by your health care provider. Make sure you discuss any questions you have with your health care provider.  

## 2013-08-26 NOTE — Progress Notes (Signed)
   Subjective:    Patient ID: Krystal Houston, female    DOB: Jun 01, 1955, 58 y.o.   MRN: [id number]  HPI 58 year old African American female, nonsmoker doesn't have concerns about moles on the back of her right leg has been present several years the last one month he is to come inflamed and irritating. She is also noticing growing more. Denies any bleeding. No history of any skin cancers.   Review of Systems  Constitutional: Negative.   Respiratory: Negative.   Cardiovascular: Negative.   Skin:       Mole on the right leg that is growing and inflamed.   Psychiatric/Behavioral: Negative.        Objective:   Physical Exam  Constitutional: She is oriented to person, place, and time. She appears well-developed and well-nourished.  Neck: Normal range of motion. Neck supple.  Cardiovascular: Normal rate, regular rhythm and normal heart sounds.   Pulmonary/Chest: Effort normal and breath sounds normal.  Musculoskeletal: Normal range of motion.  Neurological: She is alert and oriented to person, place, and time.  Skin:     Psychiatric: She has a normal mood and affect.     Informed consent was given by the patient for a shave biopsy. The site was prepped with Betadine and using a 15 blade a 1 cm shave biopsy was obtained. The specimin was placed in preservative and sent for pathology. Hemostasis was achieved with a compression. Wound care was discussed with the patient. The patient was informed that it would be one to 2 weeks before the pathology will be interpreted.     Assessment & Plan:  Krystal Houston was seen today for nevus.  Diagnoses and associated orders for this visit:  Neoplasm of uncertain behavior - Dermatology pathology

## 2013-11-10 ENCOUNTER — Other Ambulatory Visit: Payer: Self-pay | Admitting: Family

## 2013-12-15 ENCOUNTER — Encounter: Payer: Self-pay | Admitting: Family Medicine

## 2013-12-15 ENCOUNTER — Ambulatory Visit (INDEPENDENT_AMBULATORY_CARE_PROVIDER_SITE_OTHER): Payer: BC Managed Care – PPO | Admitting: Family Medicine

## 2013-12-15 VITALS — BP 128/86 | HR 67 | Temp 97.8°F | Ht 62.0 in | Wt 145.1 lb

## 2013-12-15 DIAGNOSIS — R350 Frequency of micturition: Secondary | ICD-10-CM

## 2013-12-15 LAB — POCT URINALYSIS DIPSTICK
Bilirubin, UA: NEGATIVE
Glucose, UA: NEGATIVE
KETONES UA: NEGATIVE
Nitrite, UA: NEGATIVE
PH UA: 6
Spec Grav, UA: 1.025
Urobilinogen, UA: 0.2

## 2013-12-15 MED ORDER — CIPROFLOXACIN HCL 250 MG PO TABS: 250.0000 mg | ORAL_TABLET | Freq: Two times a day (BID) | ORAL | Status: DC

## 2013-12-15 NOTE — Progress Notes (Signed)
Pre visit review using our clinic review tool, if applicable. No additional management support is needed unless otherwise documented below in the visit note. 

## 2013-12-15 NOTE — Progress Notes (Signed)
  HPI:  Acute visit for:  1) Dysuria: -started about 4-5 days ago -symptoms: urgency, dysuria, frequency -denies: fevers, flank pain, NVD, gross hematuria, vaginal symptoms or concerns for STI -reports hx of UTI and this feels similar  ROS: See pertinent positives and negatives per HPI.  Past Medical History  Diagnosis Date  . Hyperlipidemia   . Hypertension   . Brain aneurysm     Past Surgical History  Procedure Laterality Date  . Cerebral aneurysm repair    . Vaginal deliveries      x 3  . Abdominal hysterectomy      Family History  Problem Relation Age of Onset  . Heart disease Mother   . Diabetes Father   . Coronary artery disease Other   . Diabetes Other     History   Social History  . Marital Status: Legally Separated    Spouse Name: N/A    Number of Children: N/A  . Years of Education: N/A   Social History Main Topics  . Smoking status: Never Smoker   . Smokeless tobacco: Never Used  . Alcohol Use: No  . Drug Use: No  . Sexual Activity: None   Other Topics Concern  . None   Social History Narrative    Current outpatient prescriptions: aspirin EC 81 MG tablet, Take 81 mg by mouth daily., Disp: , Rfl: ;  lisinopril (PRINIVIL,ZESTRIL) 10 MG tablet, TAKE 1 TABLET (10 MG TOTAL) BY MOUTH DAILY., Disp: 90 tablet, Rfl: 0;  ciprofloxacin (CIPRO) 250 MG tablet, Take 1 tablet (250 mg total) by mouth 2 (two) times daily., Disp: 6 tablet, Rfl: 0  EXAM:  Filed Vitals:   12/15/13 1409  BP: 128/86  Pulse: 67  Temp: 97.8 F (36.6 C)    Body mass index is 26.53 kg/(m^2).  GENERAL: vitals reviewed and listed above, alert, oriented, appears well hydrated and in no acute distress  HEENT: atraumatic, conjunttiva clear, no obvious abnormalities on inspection of external nose and ears  NECK: no obvious masses on inspection  LUNGS: clear to auscultation bilaterally, no wheezes, rales or rhonchi, good air movement  CV: HRRR, no peripheral edema  ABD: no  CVA TTP  MS: moves all extremities without noticeable abnormality  PSYCH: pleasant and cooperative, no obvious depression or anxiety  ASSESSMENT AND PLAN:  Discussed the following assessment and plan:  Urinary frequency - Plan: POC Urinalysis Dipstick, ciprofloxacin (CIPRO) 250 MG tablet  -udip results discussed and cx pending - she opted for empiric abx as she is sure UTI given her hx -preventive strategies discussed -advised follow up if any persistent symptoms -Patient advised to return or notify a doctor immediately if symptoms worsen or persist or new concerns arise.  There are no Patient Instructions on file for this visit.   Colin Benton R.

## 2013-12-18 ENCOUNTER — Telehealth: Payer: Self-pay | Admitting: Internal Medicine

## 2013-12-18 NOTE — Telephone Encounter (Signed)
Pt was seen 12/14 for poss uti.  Pt  States her symptoms are not any better!  Pt would like to know if lab results are back and if she could possibly get something else to help clear up this uti [facility]

## 2013-12-18 NOTE — Telephone Encounter (Signed)
Abx should have taken care of infection if UTI. If same symptoms, can we get her appt to re-eval and recheck urine?  Can use azo if needed until then.Thanks.

## 2013-12-18 NOTE — Telephone Encounter (Signed)
I called the pt and scheduled an appt for tomorrow at 3pm.

## 2013-12-19 ENCOUNTER — Encounter: Payer: Self-pay | Admitting: Family Medicine

## 2013-12-19 ENCOUNTER — Ambulatory Visit (INDEPENDENT_AMBULATORY_CARE_PROVIDER_SITE_OTHER): Payer: BC Managed Care – PPO | Admitting: Family Medicine

## 2013-12-19 VITALS — BP 120/76 | HR 60 | Temp 98.0°F | Ht 62.0 in | Wt 146.0 lb

## 2013-12-19 DIAGNOSIS — R300 Dysuria: Secondary | ICD-10-CM

## 2013-12-19 LAB — POCT URINALYSIS DIPSTICK
BILIRUBIN UA: NEGATIVE
Glucose, UA: NEGATIVE
Ketones, UA: NEGATIVE
Nitrite, UA: NEGATIVE
PH UA: 7
Protein, UA: NEGATIVE
Spec Grav, UA: 1.02
Urobilinogen, UA: 1

## 2013-12-19 NOTE — Progress Notes (Signed)
  HPI:  Acute visit for:  Dysuria: -seen recently had classic symptoms for uti pus and tr blood and treated with cipro for 3 days -reports still was having symptoms until finished abx, no no symptoms -denies: dysuria, frequency, urgency, abd pain, vaginal discharge, flank pain, NVD, hematuria   ROS: See pertinent positives and negatives per HPI.  Past Medical History  Diagnosis Date  . Hyperlipidemia   . Hypertension   . Brain aneurysm     Past Surgical History  Procedure Laterality Date  . Cerebral aneurysm repair    . Vaginal deliveries      x 3  . Abdominal hysterectomy      Family History  Problem Relation Age of Onset  . Heart disease Mother   . Diabetes Father   . Coronary artery disease Other   . Diabetes Other     History   Social History  . Marital Status: Legally Separated    Spouse Name: N/A    Number of Children: N/A  . Years of Education: N/A   Social History Main Topics  . Smoking status: Never Smoker   . Smokeless tobacco: Never Used  . Alcohol Use: No  . Drug Use: No  . Sexual Activity: None   Other Topics Concern  . None   Social History Narrative    Current outpatient prescriptions: aspirin EC 81 MG tablet, Take 81 mg by mouth daily., Disp: , Rfl: ;  lisinopril (PRINIVIL,ZESTRIL) 10 MG tablet, TAKE 1 TABLET (10 MG TOTAL) BY MOUTH DAILY., Disp: 90 tablet, Rfl: 0  EXAM:  Filed Vitals:   12/19/13 1449  BP: 120/76  Pulse: 60  Temp: 98 F (36.7 C)    Body mass index is 26.7 kg/(m^2).  GENERAL: vitals reviewed and listed above, alert, oriented, appears well hydrated and in no acute distress  HEENT: atraumatic, conjunttiva clear, no obvious abnormalities on inspection of external nose and ears  NECK: no obvious masses on inspection  LUNGS: clear to auscultation bilaterally, no wheezes, rales or rhonchi, good air movement  CV: HRRR, no peripheral edema  ABD: BS+, soft, NTTP, no CVA TTP  MS: moves all extremities without  noticeable abnormality  PSYCH: pleasant and cooperative, no obvious depression or anxiety  ASSESSMENT AND PLAN:  Discussed the following assessment and plan:  Dysuria - Plan: POC Urinalysis Dipstick  -dip with tr blood and leuks, no symptoms now that she completed abx, advised assistant to add urine micro and culture -if blood on micro and not inf repeat in 1 month -Patient advised to return or notify a doctor immediately if symptoms worsen or persist or new concerns arise.  There are no Patient Instructions on file for this visit.   Colin Benton R.

## 2013-12-19 NOTE — Addendum Note (Signed)
Addended by: Agnes Lawrence on: 12/19/2013 04:35 PM   Modules accepted: Orders

## 2013-12-19 NOTE — Progress Notes (Signed)
Pre visit review using our clinic review tool, if applicable. No additional management support is needed unless otherwise documented below in the visit note. 

## 2013-12-20 LAB — URINALYSIS, MICROSCOPIC ONLY
Casts: NONE SEEN
Crystals: NONE SEEN

## 2013-12-22 LAB — URINE CULTURE

## 2013-12-22 MED ORDER — NITROFURANTOIN MONOHYD MACRO 100 MG PO CAPS: 100.0000 mg | ORAL_CAPSULE | Freq: Two times a day (BID) | ORAL | Status: DC

## 2013-12-22 NOTE — Addendum Note (Signed)
Addended by: Agnes Lawrence on: 12/22/2013 05:02 PM   Modules accepted: Orders

## 2013-12-23 ENCOUNTER — Telehealth: Payer: Self-pay | Admitting: Internal Medicine

## 2013-12-23 NOTE — Telephone Encounter (Signed)
Pt said she would like a call back about lab results. She also is asking if she had a UTI.

## 2013-12-23 NOTE — Telephone Encounter (Signed)
I called the pt and advised her the culture showed a little bacteria per Dr Maudie Mercury and informed her again the antibiotic was sent to her pharmacy and she agreed.

## 2014-02-05 ENCOUNTER — Ambulatory Visit (INDEPENDENT_AMBULATORY_CARE_PROVIDER_SITE_OTHER): Payer: BLUE CROSS/BLUE SHIELD | Admitting: Internal Medicine

## 2014-02-05 ENCOUNTER — Encounter: Payer: Self-pay | Admitting: Internal Medicine

## 2014-02-05 VITALS — BP 132/78 | HR 65 | Temp 97.9°F | Resp 16 | Wt 145.0 lb

## 2014-02-05 DIAGNOSIS — I10 Essential (primary) hypertension: Secondary | ICD-10-CM

## 2014-02-05 DIAGNOSIS — R350 Frequency of micturition: Secondary | ICD-10-CM

## 2014-02-05 DIAGNOSIS — R300 Dysuria: Secondary | ICD-10-CM

## 2014-02-05 LAB — POCT URINALYSIS DIPSTICK
Bilirubin, UA: NEGATIVE
Glucose, UA: NEGATIVE
KETONES UA: NEGATIVE
Nitrite, UA: NEGATIVE
PH UA: 6
Protein, UA: NEGATIVE
Spec Grav, UA: 1.02
UROBILINOGEN UA: 0.2

## 2014-02-05 MED ORDER — CIPROFLOXACIN HCL 500 MG PO TABS: 500.0000 mg | ORAL_TABLET | Freq: Two times a day (BID) | ORAL | Status: DC

## 2014-02-05 NOTE — Progress Notes (Signed)
   Subjective:    Patient ID: Krystal Houston, female    DOB: 14-Aug-1955, 59 y.o.   MRN: [id number]  HPI 59 year old patient who presents with a chief complaint of urinary frequency, dysuria and urgency of 3 days' duration.  There is been no fever or chills.  She has treated hypertension.  In December she was treated for similar symptoms.  Urine culture revealed a pure Escherichia coli species but less than 100,000 colonies per cc ; she was treated with nitrofurantoin with prompt resolution of her symptoms.  She has a sulfa allergy and her last urine culture was resistant to ampicillin  Past Medical History  Diagnosis Date  . Hyperlipidemia   . Hypertension   . Brain aneurysm     History   Social History  . Marital Status: Legally Separated    Spouse Name: N/A    Number of Children: N/A  . Years of Education: N/A   Occupational History  . Not on file.   Social History Main Topics  . Smoking status: Never Smoker   . Smokeless tobacco: Never Used  . Alcohol Use: No  . Drug Use: No  . Sexual Activity: Not on file   Other Topics Concern  . Not on file   Social History Narrative    Past Surgical History  Procedure Laterality Date  . Cerebral aneurysm repair    . Vaginal deliveries      x 3  . Abdominal hysterectomy      Family History  Problem Relation Age of Onset  . Heart disease Mother   . Diabetes Father   . Coronary artery disease Other   . Diabetes Other     Allergies  Allergen Reactions  . Sulfamethoxazole Rash and Other (See Comments)    REACTION: vomiting    Current Outpatient Prescriptions on File Prior to Visit  Medication Sig Dispense Refill  . aspirin EC 81 MG tablet Take 81 mg by mouth daily.    Marland Kitchen lisinopril (PRINIVIL,ZESTRIL) 10 MG tablet TAKE 1 TABLET (10 MG TOTAL) BY MOUTH DAILY. 90 tablet 0  . nitrofurantoin, macrocrystal-monohydrate, (MACROBID) 100 MG capsule Take 1 capsule (100 mg total) by mouth 2 (two) times daily. 14 capsule 0   No  current facility-administered medications on file prior to visit.    BP 132/78 mmHg  Pulse 65  Temp(Src) 97.9 F (36.6 C) (Oral)  Resp 16  Wt 145 lb (65.772 kg)  SpO2 90%      Review of Systems  Genitourinary: Positive for dysuria, urgency and frequency. Negative for hematuria.       Objective:   Physical Exam  Constitutional: She appears well-developed and well-nourished. No distress.  Repeat blood pressure improved at 120 over 74          Assessment & Plan:   Recurrent cystitis.  Will treat with Cipro for 3 days Hypertension, well-controlled

## 2014-02-05 NOTE — Progress Notes (Signed)
Pre visit review using our clinic review tool, if applicable. No additional management support is needed unless otherwise documented below in the visit note. 

## 2014-02-05 NOTE — Patient Instructions (Signed)
HOME CARE INSTRUCTIONS  If you were prescribed antibiotics, take them exactly as your caregiver instructs you. Finish the medication even if you feel better after you have only taken some of the medication.  Drink enough water and fluids to keep your urine clear or pale yellow.  Avoid caffeine, tea, and carbonated beverages. They tend to irritate your bladder.  Empty your bladder often. Avoid holding urine for long periods of time.  Empty your bladder before and after sexual intercourse.  After a bowel movement, women should cleanse from front to back. Use each tissue only once.

## 2014-02-09 ENCOUNTER — Telehealth: Payer: Self-pay | Admitting: Internal Medicine

## 2014-02-09 MED ORDER — AMOXICILLIN 500 MG PO CAPS: 500.0000 mg | ORAL_CAPSULE | Freq: Three times a day (TID) | ORAL | Status: DC

## 2014-02-09 NOTE — Telephone Encounter (Signed)
Pt said she is still having UTI symptons and is asking if she can have a refill on ciprofloxacin (CIPRO) 500 MG tablet    [facility]

## 2014-02-09 NOTE — Telephone Encounter (Signed)
Spoke to pt, told her Rx for Amoxicillin 500 mg 3 times a day with meals sent to pharmacy. Pt verbalized understanding.

## 2014-02-09 NOTE — Telephone Encounter (Signed)
Please advise 

## 2014-02-09 NOTE — Telephone Encounter (Signed)
Please call in a different antibiotic  Amoxicillin 500, #21 one 3 times a day with meals

## 2014-02-17 ENCOUNTER — Encounter: Payer: Self-pay | Admitting: Family Medicine

## 2014-02-17 ENCOUNTER — Ambulatory Visit (INDEPENDENT_AMBULATORY_CARE_PROVIDER_SITE_OTHER): Payer: BLUE CROSS/BLUE SHIELD | Admitting: Family Medicine

## 2014-02-17 ENCOUNTER — Ambulatory Visit: Payer: BLUE CROSS/BLUE SHIELD | Admitting: Family Medicine

## 2014-02-17 VITALS — BP 134/91 | HR 68 | Temp 98.1°F | Wt 147.0 lb

## 2014-02-17 DIAGNOSIS — Z299 Encounter for prophylactic measures, unspecified: Secondary | ICD-10-CM

## 2014-02-17 DIAGNOSIS — Z418 Encounter for other procedures for purposes other than remedying health state: Secondary | ICD-10-CM

## 2014-02-17 DIAGNOSIS — N390 Urinary tract infection, site not specified: Secondary | ICD-10-CM

## 2014-02-17 LAB — POCT URINALYSIS DIPSTICK
BILIRUBIN UA: NEGATIVE
GLUCOSE UA: NEGATIVE
Ketones, UA: NEGATIVE
Leukocytes, UA: NEGATIVE
Nitrite, UA: NEGATIVE
Protein, UA: NEGATIVE
SPEC GRAV UA: 1.02
UROBILINOGEN UA: 0.2
pH, UA: 6.5

## 2014-02-17 MED ORDER — NITROFURANTOIN MONOHYD MACRO 100 MG PO CAPS: 100.0000 mg | ORAL_CAPSULE | Freq: Two times a day (BID) | ORAL | Status: DC

## 2014-02-17 NOTE — Progress Notes (Signed)
   Subjective:    Patient ID: Krystal Houston, female    DOB: 02-18-1955, 59 y.o.   MRN: [id number]  HPI Here for continued symptoms of urinary urgency and burning. She has mild right flank pains and her urine has a strong odor. No fever or nausea. She has taken Cipro and Amoxicillin in the last 3 weeks but these did not help. Of note her urine culture in December grew an E coli that was resistant to both these antibiotics but which was sensitive to [employer]. She is drinking plenty of water.    Review of Systems  Constitutional: Negative.   Gastrointestinal: Negative.   Genitourinary: Positive for dysuria, urgency, frequency and flank pain. Negative for hematuria.       Objective:   Physical Exam  Constitutional: She appears well-developed and well-nourished.  Abdominal: Soft. Bowel sounds are normal. She exhibits no distension and no mass. There is no tenderness. There is no rebound and no guarding.          Assessment & Plan:  We will treat with Macrobid for 7 days. Recheck prn

## 2014-04-06 ENCOUNTER — Ambulatory Visit (INDEPENDENT_AMBULATORY_CARE_PROVIDER_SITE_OTHER): Payer: BLUE CROSS/BLUE SHIELD | Admitting: Internal Medicine

## 2014-04-06 ENCOUNTER — Encounter: Payer: Self-pay | Admitting: Internal Medicine

## 2014-04-06 VITALS — BP 110/74 | HR 68 | Temp 98.1°F | Resp 20 | Ht 62.0 in | Wt 143.0 lb

## 2014-04-06 DIAGNOSIS — R300 Dysuria: Secondary | ICD-10-CM | POA: Diagnosis not present

## 2014-04-06 DIAGNOSIS — I10 Essential (primary) hypertension: Secondary | ICD-10-CM | POA: Diagnosis not present

## 2014-04-06 LAB — POCT URINALYSIS DIPSTICK
Bilirubin, UA: NEGATIVE
Glucose, UA: NEGATIVE
NITRITE UA: NEGATIVE
PH UA: 5.5
Protein, UA: NEGATIVE
Spec Grav, UA: >=1.03
UROBILINOGEN UA: 1

## 2014-04-06 MED ORDER — NITROFURANTOIN MONOHYD MACRO 100 MG PO CAPS: 100.0000 mg | ORAL_CAPSULE | Freq: Two times a day (BID) | ORAL | Status: DC

## 2014-04-06 NOTE — Progress Notes (Signed)
Pre visit review using our clinic review tool, if applicable. No additional management support is needed unless otherwise documented below in the visit note. 

## 2014-04-06 NOTE — Progress Notes (Signed)
   Subjective:    Patient ID: Krystal Houston, female    DOB: 08-01-1955, 59 y.o.   MRN: [id number]  HPI  59 year old patient who has treated hypertension.  She also has a history recurrent urinary tract infections and a sulfa allergy.  For the past 4-5 days she has noted increasing odor to the urine, as well as occasional dysuria.  No fever, chills or flank pain.  Past Medical History  Diagnosis Date  . Hyperlipidemia   . Hypertension   . Brain aneurysm     History   Social History  . Marital Status: Legally Separated    Spouse Name: N/A  . Number of Children: N/A  . Years of Education: N/A   Occupational History  . Not on file.   Social History Main Topics  . Smoking status: Never Smoker   . Smokeless tobacco: Never Used  . Alcohol Use: No  . Drug Use: No  . Sexual Activity: Not on file   Other Topics Concern  . Not on file   Social History Narrative    Past Surgical History  Procedure Laterality Date  . Cerebral aneurysm repair    . Vaginal deliveries      x 3  . Abdominal hysterectomy      Family History  Problem Relation Age of Onset  . Heart disease Mother   . Diabetes Father   . Coronary artery disease Other   . Diabetes Other     Allergies  Allergen Reactions  . Sulfamethoxazole Rash and Other (See Comments)    REACTION: vomiting    Current Outpatient Prescriptions on File Prior to Visit  Medication Sig Dispense Refill  . aspirin EC 81 MG tablet Take 81 mg by mouth daily.    Marland Kitchen lisinopril (PRINIVIL,ZESTRIL) 10 MG tablet TAKE 1 TABLET (10 MG TOTAL) BY MOUTH DAILY. 90 tablet 0   No current facility-administered medications on file prior to visit.    BP 110/74 mmHg  Pulse 68  Temp(Src) 98.1 F (36.7 C) (Oral)  Resp 20  Ht 5\' 2"  (1.575 m)  Wt 143 lb (64.864 kg)  BMI 26.15 kg/m2  SpO2 95%     Review of Systems  Genitourinary: Positive for dysuria and urgency.       Objective:   Physical Exam  Constitutional: She appears  well-developed and well-nourished. No distress.  Blood pressure low normal range  Cardiovascular: Normal rate and regular rhythm.   Pulmonary/Chest: Effort normal and breath sounds normal. No respiratory distress.          Assessment & Plan:   Early UTI.  Patient states that she has done well with Macrobid.  Will treat for 7 days Hypertension, well-controlled

## 2014-04-06 NOTE — Patient Instructions (Signed)
Drink as much fluid as you  can tolerate over the next few days  Take your antibiotic as prescribed until ALL of it is gone, but stop if you develop a rash, swelling, or any side effects of the medication.  Contact our office as soon as possible if  there are side effects of the medication.   

## 2014-04-17 ENCOUNTER — Other Ambulatory Visit: Payer: Self-pay

## 2014-04-17 DIAGNOSIS — Z1231 Encounter for screening mammogram for malignant neoplasm of breast: Secondary | ICD-10-CM

## 2014-04-20 ENCOUNTER — Ambulatory Visit
Admission: RE | Admit: 2014-04-20 | Discharge: 2014-04-20 | Disposition: A | Discharge status: Home / Self Care | Payer: BLUE CROSS/BLUE SHIELD | Source: Ambulatory Visit

## 2014-04-20 DIAGNOSIS — Z1231 Encounter for screening mammogram for malignant neoplasm of breast: Secondary | ICD-10-CM

## 2014-04-21 ENCOUNTER — Ambulatory Visit (INDEPENDENT_AMBULATORY_CARE_PROVIDER_SITE_OTHER): Payer: BLUE CROSS/BLUE SHIELD | Admitting: Family Medicine

## 2014-04-21 DIAGNOSIS — R69 Illness, unspecified: Secondary | ICD-10-CM

## 2014-04-21 NOTE — Progress Notes (Signed)
NO SHOW

## 2014-04-22 ENCOUNTER — Telehealth: Payer: Self-pay | Admitting: Family Medicine

## 2014-04-22 NOTE — Telephone Encounter (Signed)
Pt missed her est appt yesterday. doesn't remember getting reminder call.  resc for July which was first available However pt is having issues w/ being tired and drained feeling.  Scheduled appt for next week.   Is it ok to switch this to a 30 min and address it all? Or does pt still need to keep July appt?

## 2014-04-22 NOTE — Telephone Encounter (Signed)
Krystal Houston,  I have had a fair number of transfer/new pt visits not show up and comments when they do that they did not get reminder call. Two such messages today. I am forwarding these to you and I told Estill Bamberg.  Is there a way to make sure that all patients are getting reminders before their appointments? It seems they rely on these :)Thank you.

## 2014-04-28 ENCOUNTER — Encounter: Payer: Self-pay | Admitting: Family Medicine

## 2014-04-28 ENCOUNTER — Ambulatory Visit (INDEPENDENT_AMBULATORY_CARE_PROVIDER_SITE_OTHER): Payer: BLUE CROSS/BLUE SHIELD | Admitting: Family Medicine

## 2014-04-28 VITALS — BP 100/72 | HR 68 | Temp 98.0°F | Ht 62.0 in | Wt 142.8 lb

## 2014-04-28 DIAGNOSIS — R350 Frequency of micturition: Secondary | ICD-10-CM

## 2014-04-28 DIAGNOSIS — R5383 Other fatigue: Secondary | ICD-10-CM

## 2014-04-28 DIAGNOSIS — Z114 Encounter for screening for human immunodeficiency virus [HIV]: Secondary | ICD-10-CM | POA: Diagnosis not present

## 2014-04-28 LAB — POCT URINALYSIS DIPSTICK
BILIRUBIN UA: NEGATIVE
Glucose, UA: NEGATIVE
Ketones, UA: NEGATIVE
Nitrite, UA: POSITIVE
Protein, UA: NEGATIVE
Spec Grav, UA: 1.02
UROBILINOGEN UA: 1
pH, UA: 7

## 2014-04-28 MED ORDER — NITROFURANTOIN MONOHYD MACRO 100 MG PO CAPS: 100.0000 mg | ORAL_CAPSULE | Freq: Two times a day (BID) | ORAL | Status: DC

## 2014-04-28 NOTE — Patient Instructions (Signed)
BEFORE YOU LEAVE: -schedule fasting lab appointment in 2 weeks  Take the antibiotic  Avoid douching Urinate after sex No harsh soaps or excessive washing Avoid baths and hottubs  Follow up as needed and as scheduled

## 2014-04-28 NOTE — Progress Notes (Addendum)
HPI:   Krystal Houston is a 59 yo F, prior pt of Dr. Leanne Houston, currently without a PCP here for an acute visit for:  Fatigue/Dysuria: -for 1 week -she is frustrated that she keeps getting UTIs - 5 in the last 5 months -symptoms: odor, frequency, urgency, feels tired -denies: fevers, NVD, flank pain, abd pain, vaginal discharge or bleeding, hematuria -sexually active - vigorous sex -she douches a lot, she does have some vaginal dryness -hx complete hysterectomy with oophorectomy for fibroids per her report  Colon - she says she had this with [facility], but these is no record of this. Reports was normal. ROS: See pertinent positives and negatives per HPI.  Past Medical History  Diagnosis Date  . Hyperlipidemia   . Hypertension   . Brain aneurysm   . Leukocytosis     she reports she has had this her whole life, on labs back to 2009    Past Surgical History  Procedure Laterality Date  . Cerebral aneurysm repair    . Vaginal deliveries      x 3  . Abdominal hysterectomy      Family History  Problem Relation Age of Onset  . Heart disease Mother   . Diabetes Father   . Coronary artery disease Other   . Diabetes Other     History   Social History  . Marital Status: Legally Separated    Spouse Name: N/A  . Number of Children: N/A  . Years of Education: N/A   Social History Main Topics  . Smoking status: Never Smoker   . Smokeless tobacco: Never Used  . Alcohol Use: No  . Drug Use: No  . Sexual Activity: Not on file   Other Topics Concern  . None   Social History Narrative     Current outpatient prescriptions:  .  aspirin EC 81 MG tablet, Take 81 mg by mouth daily., Disp: , Rfl:  .  lisinopril (PRINIVIL,ZESTRIL) 10 MG tablet, TAKE 1 TABLET (10 MG TOTAL) BY MOUTH DAILY., Disp: 90 tablet, Rfl: 0 .  nitrofurantoin, macrocrystal-monohydrate, (MACROBID) 100 MG capsule, Take 1 capsule (100 mg total) by mouth 2 (two) times daily., Disp: 14 capsule, Rfl:  0  EXAM:  Filed Vitals:   04/28/14 1501  BP: 100/72  Pulse: 68  Temp: 98 F (36.7 C)    Body mass index is 26.11 kg/(m^2).  GENERAL: vitals reviewed and listed above, alert, oriented, appears well hydrated and in no acute distress  HEENT: atraumatic, conjunttiva clear, no obvious abnormalities on inspection of external nose and ears  NECK: no obvious masses on inspection  LUNGS: clear to auscultation bilaterally, no wheezes, rales or rhonchi, good air movement  CV: HRRR, no peripheral edema  ABD: BS+, soft, NTTP, no CVA TTP  MS: moves all extremities without noticeable abnormality  PSYCH: pleasant and cooperative, no obvious depression or anxiety  ASSESSMENT AND PLAN:  Discussed the following assessment and plan:  Urinary frequency - Plan: POC Urinalysis Dipstick, CBC with Differential, Hemoglobin A1c, Lipid Panel Other fatigue -udip with tr blo, nit+, leuk + c/w UTI - will tx with abx -only one culture with all of her episodes of dysuria (x5 in last 6 months) -will get micro and if blood repeat ua and micro after infection cleared -advised against douching and harsh washing -consider vag estrogen for likely vag atrophy, void after sex -discussed urology referral if recurs despite these measurse or if microscopic hematuria w/out infection -labs per orders for chronic fatigue -  she reports chronic leukocytosis her whole life for > 20 years, so likely her baseline -Patient advised to return or notify a doctor immediately if symptoms worsen or persist or new concerns arise.  Patient Instructions  BEFORE YOU LEAVE: -schedule fasting lab appointment in 2 weeks  Take the antibiotic  Avoid douching Urinate after sex No harsh soaps or excessive washing Avoid baths and hottubs  Follow up as needed and as scheduled     Krystal Houston R.

## 2014-04-28 NOTE — Addendum Note (Signed)
Addended by: Agnes Lawrence on: 04/28/2014 04:24 PM   Modules accepted: Orders

## 2014-04-28 NOTE — Progress Notes (Signed)
Pre visit review using our clinic review tool, if applicable. No additional management support is needed unless otherwise documented below in the visit note. 

## 2014-04-28 NOTE — Addendum Note (Signed)
Addended by: Lucretia Kern on: 04/28/2014 03:33 PM   Modules accepted: Orders

## 2014-04-29 ENCOUNTER — Telehealth: Payer: Self-pay | Admitting: Family Medicine

## 2014-04-29 LAB — URINALYSIS, MICROSCOPIC ONLY

## 2014-04-29 NOTE — Telephone Encounter (Signed)
Pt states the rx for her uti nitrofurantoin, macrocrystal-monohydrate, (MACROBID) 100 MG capsule  Will not be covered because she just got 4/5.  Pt would like to know if dr kim can call in another rx. [facility] [address] Pt is still having some bad symptoms. Pt aware dr Maudie Mercury out this afternoon and return in the am.

## 2014-04-30 NOTE — Telephone Encounter (Signed)
Pt said she did get the money to pay for the rx and does not need Dr Maudie Mercury to change it

## 2014-05-01 LAB — URINE CULTURE: Colony Count: >=100000

## 2014-05-01 MED ORDER — CIPROFLOXACIN HCL 500 MG PO TABS: 500.0000 mg | ORAL_TABLET | Freq: Two times a day (BID) | ORAL | Status: DC

## 2014-05-01 NOTE — Addendum Note (Signed)
Addended by: Agnes Lawrence on: 05/01/2014 04:59 PM   Modules accepted: Orders

## 2014-07-09 ENCOUNTER — Ambulatory Visit (INDEPENDENT_AMBULATORY_CARE_PROVIDER_SITE_OTHER): Payer: Self-pay | Admitting: Family Medicine

## 2014-07-09 DIAGNOSIS — R69 Illness, unspecified: Secondary | ICD-10-CM

## 2014-07-09 NOTE — Progress Notes (Signed)
  NO SHOW for visit to transfer care. On ROC prior to visit:  HTN: -meds: lisinopril 10 mg daily -denies:  HLD: -no meds -denies hx statin intol  Recurrent UTIs:  Hx Brain aneurysm: -sees Dr. Estanislado Pandy for this -last head imaging in 2014

## 2014-07-20 ENCOUNTER — Other Ambulatory Visit: Payer: Self-pay | Admitting: Family

## 2014-07-27 NOTE — Telephone Encounter (Signed)
Please set up transfer visit - I would suggest with Tommi Rumps if possible as she no showed this visit with me and I am full. Refill to appt. If insists on seeing me then schedule in available 30 minute appt and remind her to make sure to come 15 minutes prior to the appt. Thanks.

## 2014-07-27 NOTE — Telephone Encounter (Signed)
Left a message for the pt to return my call.  

## 2014-10-16 ENCOUNTER — Other Ambulatory Visit: Payer: Self-pay | Admitting: Family

## 2014-10-30 ENCOUNTER — Encounter: Payer: Self-pay | Admitting: Family Medicine

## 2014-10-30 ENCOUNTER — Ambulatory Visit (INDEPENDENT_AMBULATORY_CARE_PROVIDER_SITE_OTHER): Payer: BLUE CROSS/BLUE SHIELD | Admitting: Family Medicine

## 2014-10-30 VITALS — BP 100/70 | HR 97 | Temp 97.0°F | Ht 62.0 in | Wt 141.7 lb

## 2014-10-30 DIAGNOSIS — Z23 Encounter for immunization: Secondary | ICD-10-CM

## 2014-10-30 DIAGNOSIS — R300 Dysuria: Secondary | ICD-10-CM

## 2014-10-30 LAB — POCT URINALYSIS DIPSTICK
BILIRUBIN UA: NEGATIVE
Blood, UA: NEGATIVE
Glucose, UA: NEGATIVE
Ketones, UA: NEGATIVE
LEUKOCYTES UA: NEGATIVE
Nitrite, UA: NEGATIVE
PH UA: 6
PROTEIN UA: NEGATIVE
Spec Grav, UA: >=1.03
Urobilinogen, UA: 0.2

## 2014-10-30 NOTE — Patient Instructions (Signed)
BEFORE YOU LEAVE -flu shot  Follow up if any symptoms persist or recur.

## 2014-10-30 NOTE — Progress Notes (Signed)
Pre visit review using our clinic review tool, if applicable. No additional management support is needed unless otherwise documented below in the visit note. 

## 2014-10-30 NOTE — Progress Notes (Signed)
  HPI:  Acute visit for:  Dysuria: -mild odor to urine and mild intermittent dysuria an dmild low back pain breifly yesterday -hx of UTI so she wanted to check -denies: fevers, malaise, flank pain, vaginal symptoms, NV, abd pain, hematuria  ROS: See pertinent positives and negatives per HPI.  Past Medical History  Diagnosis Date  . Hyperlipidemia   . Hypertension   . Brain aneurysm   . Leukocytosis     she reports she has had this her whole life, on labs back to 2009    Past Surgical History  Procedure Laterality Date  . Cerebral aneurysm repair    . Vaginal deliveries      x 3  . Abdominal hysterectomy      Family History  Problem Relation Age of Onset  . Heart disease Mother   . Diabetes Father   . Coronary artery disease Other   . Diabetes Other     Social History   Social History  . Marital Status: Legally Separated    Spouse Name: N/A  . Number of Children: N/A  . Years of Education: N/A   Social History Main Topics  . Smoking status: Never Smoker   . Smokeless tobacco: Never Used  . Alcohol Use: No  . Drug Use: No  . Sexual Activity: Not Asked   Other Topics Concern  . None   Social History Narrative     Current outpatient prescriptions:  .  aspirin EC 81 MG tablet, Take 81 mg by mouth daily., Disp: , Rfl:  .  lisinopril (PRINIVIL,ZESTRIL) 10 MG tablet, TAKE 1 TABLET (10 MG TOTAL) BY MOUTH DAILY., Disp: 30 tablet, Rfl: 0  EXAM:  Filed Vitals:   10/30/14 1405  BP: 100/70  Pulse: 97  Temp: 97 F (36.1 C)    Body mass index is 25.91 kg/(m^2).  GENERAL: vitals reviewed and listed above, alert, oriented, appears well hydrated and in no acute distress  HEENT: atraumatic, conjunttiva clear, no obvious abnormalities on inspection of external nose and ears  NECK: no obvious masses on inspection  LUNGS: clear to auscultation bilaterally, no wheezes, rales or rhonchi, good air movement  CV: HRRR, no peripheral edema  ABD: BS+, soft, nttp,  no cva ttp  MS: moves all extremities without noticeable abnormality  PSYCH: pleasant and cooperative, no obvious depression or anxiety  ASSESSMENT AND PLAN:  Discussed the following assessment and plan:  Dysuria - Plan: POC Urinalysis Dipstick  -udip normal -flu shot -fluids, cranberry, follow up if worsens, recurs or persists -Patient advised to return or notify a doctor immediately if symptoms worsen or persist or new concerns arise.  There are no Patient Instructions on file for this visit.   Colin Benton R.

## 2014-12-25 ENCOUNTER — Ambulatory Visit: Payer: BLUE CROSS/BLUE SHIELD | Admitting: Family Medicine

## 2015-04-15 ENCOUNTER — Encounter: Payer: Self-pay | Admitting: Family Medicine

## 2015-04-15 ENCOUNTER — Ambulatory Visit (INDEPENDENT_AMBULATORY_CARE_PROVIDER_SITE_OTHER): Payer: BLUE CROSS/BLUE SHIELD | Admitting: Family Medicine

## 2015-04-15 VITALS — BP 110/70 | HR 79 | Temp 98.6°F | Ht 62.0 in | Wt 146.4 lb

## 2015-04-15 DIAGNOSIS — R300 Dysuria: Secondary | ICD-10-CM

## 2015-04-15 LAB — POCT URINALYSIS DIPSTICK
BILIRUBIN UA: NEGATIVE
Blood, UA: NEGATIVE
GLUCOSE UA: NEGATIVE
Ketones, UA: NEGATIVE
Leukocytes, UA: NEGATIVE
Nitrite, UA: NEGATIVE
Protein, UA: NEGATIVE
SPEC GRAV UA: 1.02
Urobilinogen, UA: 0.2
pH, UA: 6

## 2015-04-15 NOTE — Addendum Note (Signed)
Addended by: Agnes Lawrence on: 04/15/2015 03:14 PM   Modules accepted: Orders

## 2015-04-15 NOTE — Progress Notes (Signed)
Pre visit review using our clinic review tool, if applicable. No additional management support is needed unless otherwise documented below in the visit note. 

## 2015-04-15 NOTE — Patient Instructions (Signed)
Before you leave: -Please set up a 30 minute new patient visit with Dr. Maudie Mercury in the next 3 months, she would prefer an appointment after 2 PM  Please drink plenty of water. Please follow up if your symptoms recur or persist in next 1-2 weeks.  -We have ordered labs or studies at this visit. It can take up to 1-2 weeks for results and processing. We will contact you with instructions IF your results are abnormal. Normal results will be released to your [facility]. If you have not heard from [place] or can not find your results in [facility] in 2 weeks please contact our office.

## 2015-04-15 NOTE — Progress Notes (Signed)
HPI:  Krystal Houston Is a pleasant 60 year old, currently without a PCP, here for an acute visit for dysuria. Reports some mild burning with urination and frequency 2 days ago. This has resolved with drinking water. Denies fevers, nausea, vomiting, hematuria, vaginal symptoms or flank pain. History of UTI concerned for this. Reports she sees Everett Graff in gynecology for regular pelvic and Pap smears.  ROS: See pertinent positives and negatives per HPI.  Past Medical History  Diagnosis Date  . Hyperlipidemia   . Hypertension   . Brain aneurysm   . Leukocytosis     she reports she has had this her whole life, on labs back to 2009    Past Surgical History  Procedure Laterality Date  . Cerebral aneurysm repair    . Vaginal deliveries      x 3  . Abdominal hysterectomy      Family History  Problem Relation Age of Onset  . Heart disease Mother   . Diabetes Father   . Coronary artery disease Other   . Diabetes Other     Social History   Social History  . Marital Status: Legally Separated    Spouse Name: N/A  . Number of Children: N/A  . Years of Education: N/A   Social History Main Topics  . Smoking status: Never Smoker   . Smokeless tobacco: Never Used  . Alcohol Use: No  . Drug Use: No  . Sexual Activity: Not Asked   Other Topics Concern  . None   Social History Narrative     Current outpatient prescriptions:  .  aspirin EC 81 MG tablet, Take 81 mg by mouth daily., Disp: , Rfl:  .  lisinopril (PRINIVIL,ZESTRIL) 10 MG tablet, TAKE 1 TABLET (10 MG TOTAL) BY MOUTH DAILY., Disp: 30 tablet, Rfl: 0  EXAM:  Filed Vitals:   04/15/15 1449  BP: 110/70  Pulse: 79  Temp: 98.6 F (37 C)    Body mass index is 26.77 kg/(m^2).  GENERAL: vitals reviewed and listed above, alert, oriented, appears well hydrated and in no acute distress  HEENT: atraumatic, conjunttiva clear, no obvious abnormalities on inspection of external nose and ears  NECK: no obvious masses  on inspection  LUNGS: clear to auscultation bilaterally, no wheezes, rales or rhonchi, good air movement  CV: HRRR, no peripheral edema  ABD: BS+, soft, NTTP, no CVA ttp  MS: moves all extremities without noticeable abnormality  PSYCH: pleasant and cooperative, no obvious depression or anxiety  ASSESSMENT AND PLAN:  Discussed the following assessment and plan:  Dysuria - Plan: POC Urinalysis Dipstick  -urine dip okay, culture pending -She does not have a PCP, she she onlyw[facility] does not wish to see any other providers here. Advised to schedule a transfer visit.  -Patient advised to return or notify a doctor immediately if symptoms worsen or persist or new concerns arise.  Patient Instructions  Before you leave: -Please set up a 30 minute new patient visit with Dr. Maudie Mercury in the next 3 months, she would prefer an appointment after 2 PM  Please drink plenty of water. Please follow up if your symptoms recur or persist in next 1-2 weeks.  -We have ordered labs or studies at this visit. It can take up to 1-2 weeks for results and processing. We will contact you with instructions IF your results are abnormal. Normal results will be released to your [facility]. If you have not heard from [place] or can not  find your results in [facility] in 2 weeks please contact our office.            Colin Benton R.

## 2015-04-16 LAB — URINE CULTURE

## 2015-07-16 ENCOUNTER — Ambulatory Visit (INDEPENDENT_AMBULATORY_CARE_PROVIDER_SITE_OTHER): Payer: BLUE CROSS/BLUE SHIELD | Admitting: Family Medicine

## 2015-07-16 DIAGNOSIS — R69 Illness, unspecified: Secondary | ICD-10-CM

## 2015-07-16 NOTE — Progress Notes (Signed)
NO SHOW for 30 minute NPV

## 2016-02-11 ENCOUNTER — Emergency Department (HOSPITAL_COMMUNITY)
Admission: EM | Admit: 2016-02-11 | Discharge: 2016-02-11 | Disposition: A | Discharge status: Home / Self Care | Payer: BLUE CROSS/BLUE SHIELD | Attending: Emergency Medicine | Admitting: Emergency Medicine

## 2016-02-11 ENCOUNTER — Encounter (HOSPITAL_COMMUNITY): Payer: Self-pay | Admitting: Emergency Medicine

## 2016-02-11 DIAGNOSIS — R531 Weakness: Secondary | ICD-10-CM

## 2016-02-11 DIAGNOSIS — I10 Essential (primary) hypertension: Secondary | ICD-10-CM | POA: Insufficient documentation

## 2016-02-11 DIAGNOSIS — E860 Dehydration: Secondary | ICD-10-CM

## 2016-02-11 DIAGNOSIS — Z79899 Other long term (current) drug therapy: Secondary | ICD-10-CM | POA: Diagnosis not present

## 2016-02-11 DIAGNOSIS — Z7982 Long term (current) use of aspirin: Secondary | ICD-10-CM | POA: Insufficient documentation

## 2016-02-11 LAB — CBC WITH DIFFERENTIAL/PLATELET
BASOS ABS: 0 10*3/uL (ref 0.0–0.1)
BASOS PCT: 0 %
EOS PCT: 0 %
Eosinophils Absolute: 0 10*3/uL (ref 0.0–0.7)
HCT: 42.1 % (ref 36.0–46.0)
Hemoglobin: 14.1 g/dL (ref 12.0–15.0)
LYMPHS PCT: 18 %
Lymphs Abs: 1.2 10*3/uL (ref 0.7–4.0)
MCH: 26.9 pg (ref 26.0–34.0)
MCHC: 33.5 g/dL (ref 30.0–36.0)
MCV: 80.3 fL (ref 78.0–100.0)
MONO ABS: 0.5 10*3/uL (ref 0.1–1.0)
Monocytes Relative: 8 %
Neutro Abs: 5.2 10*3/uL (ref 1.7–7.7)
Neutrophils Relative %: 74 %
PLATELETS: 238 10*3/uL (ref 150–400)
RBC: 5.24 MIL/uL — AB (ref 3.87–5.11)
RDW: 13.6 % (ref 11.5–15.5)
WBC: 6.9 10*3/uL (ref 4.0–10.5)

## 2016-02-11 LAB — COMPREHENSIVE METABOLIC PANEL
ALK PHOS: 32 U/L — AB (ref 38–126)
ALT: 16 U/L (ref 14–54)
AST: 20 U/L (ref 15–41)
Albumin: 3.3 g/dL — ABNORMAL LOW (ref 3.5–5.0)
Anion gap: 8 (ref 5–15)
BILIRUBIN TOTAL: 0.8 mg/dL (ref 0.3–1.2)
BUN: 12 mg/dL (ref 6–20)
CALCIUM: 8.5 mg/dL — AB (ref 8.9–10.3)
CO2: 24 mmol/L (ref 22–32)
Chloride: 108 mmol/L (ref 101–111)
Creatinine, Ser: 0.83 mg/dL (ref 0.44–1.00)
GFR calc Af Amer: >60 mL/min (ref >60)
Glucose, Bld: 99 mg/dL (ref 65–99)
POTASSIUM: 3.3 mmol/L — AB (ref 3.5–5.1)
Sodium: 140 mmol/L (ref 135–145)
TOTAL PROTEIN: 5.5 g/dL — AB (ref 6.5–8.1)

## 2016-02-11 MED ORDER — SODIUM CHLORIDE 0.9 % IV SOLN: INTRAVENOUS | Status: DC

## 2016-02-11 MED ORDER — SODIUM CHLORIDE 0.9 % IV BOLUS (SEPSIS)
1000.0000 mL | Freq: Once | INTRAVENOUS | Status: AC
  Administered 2016-02-11: 1000 mL via INTRAVENOUS

## 2016-02-11 NOTE — ED Triage Notes (Signed)
Pt gave plasma at 3pm today, around 5pm hand cramped and turned inward, face and arms tinglings, pt is weak, unable to get into bed with out assistance

## 2016-02-11 NOTE — ED Provider Notes (Signed)
[facility] DEPT Provider Note   CSN: [id number] Arrival date & time: 02/11/16  [place]     History   Chief Complaint Chief Complaint  Patient presents with  . Weakness    HPI Krystal Houston is a 61 y.o. female.  Patient 2 hours after donating plasma started to get cramping in both hands and tingling in both upper extremity. General feeling of weakness doing a little lightheaded. Patient was concerned that something was wrong like a stroke. Patient never had this happen before. No syncope no headache no chest pain or shortness of breath no recent upper respiratory infection no fevers. No lower extremity symptoms. No speech problems.      Past Medical History:  Diagnosis Date  . Brain aneurysm   . Hyperlipidemia   . Hypertension   . Leukocytosis    she reports she has had this her whole life, on labs back to 2009    Patient Active Problem List   Diagnosis Date Noted  . HYPERLIPIDEMIA 05/22/2007  . Essential hypertension 05/22/2007  . CEREBRAL ANEURYSM 05/22/2007    Past Surgical History:  Procedure Laterality Date  . ABDOMINAL HYSTERECTOMY    . CEREBRAL ANEURYSM REPAIR    . vaginal deliveries     x 3    OB History    No data available       Home Medications    Prior to Admission medications   Medication Sig Start Date End Date Taking? Authorizing Provider  aspirin EC 81 MG tablet Take 81 mg by mouth daily.   Yes Historical Provider, MD  lisinopril (PRINIVIL,ZESTRIL) 10 MG tablet TAKE 1 TABLET (10 MG TOTAL) BY MOUTH DAILY. 10/19/14  Yes Kennyth Arnold, FNP  Misc Natural Products (ESTROVEN ENERGY PO) Take 1 tablet by mouth daily.   Yes Historical Provider, MD    Family History Family History  Problem Relation Age of Onset  . Heart disease Mother   . Diabetes Father   . Coronary artery disease Other   . Diabetes Other     Social History Social History  Substance Use Topics  . Smoking status: Never Smoker  . Smokeless tobacco: Never Used  .  Alcohol use No     Allergies   Sulfamethoxazole   Review of Systems Review of Systems  Constitutional: Positive for fatigue.  HENT: Negative for congestion.   Eyes: Negative for visual disturbance.  Respiratory: Negative for cough and shortness of breath.   Cardiovascular: Negative for chest pain.  Gastrointestinal: Negative for abdominal pain and nausea.  Genitourinary: Negative for dysuria.  Musculoskeletal: Negative for back pain and myalgias.  Skin: Negative for rash.  Neurological: Positive for weakness, light-headedness and numbness.  Hematological: Does not bruise/bleed easily.  Psychiatric/Behavioral: Negative for confusion.     Physical Exam Updated Vital Signs BP 100/70   Pulse 63   Temp 97.5 F (36.4 C) (Oral)   Resp 15   Ht 5\' 2"  (1.575 m)   Wt 63.5 kg   SpO2 100%   BMI 25.61 kg/m   Physical Exam  Constitutional: She is oriented to person, place, and time. She appears well-developed and well-nourished. No distress.  HENT:  Head: Normocephalic and atraumatic.  Mouth/Throat: Oropharynx is clear and moist.  Eyes: EOM are normal. Pupils are equal, round, and reactive to light.  Neck: Normal range of motion. Neck supple.  Cardiovascular: Normal rate, regular rhythm and normal heart sounds.   Pulmonary/Chest: Effort normal and breath sounds normal. No respiratory distress.  Abdominal: Soft.  Bowel sounds are normal.  Musculoskeletal: Normal range of motion. She exhibits no edema.  Neurological: She is alert and oriented to person, place, and time. No cranial nerve deficit or sensory deficit. She exhibits normal muscle tone. Coordination normal.  Skin: Skin is warm.  Nursing note and vitals reviewed.    ED Treatments / Results  Labs (all labs ordered are listed, but only abnormal results are displayed) Labs Reviewed  COMPREHENSIVE METABOLIC PANEL - Abnormal; Notable for the following:       Result Value   Potassium 3.3 (*)    Calcium 8.5 (*)    Total  Protein 5.5 (*)    Albumin 3.3 (*)    Alkaline Phosphatase 32 (*)    All other components within normal limits  CBC WITH DIFFERENTIAL/PLATELET - Abnormal; Notable for the following:    RBC 5.24 (*)    All other components within normal limits    EKG  EKG Interpretation  Date/Time:  Friday February 11 2016 18:30:08 EST Ventricular Rate:  68 PR Interval:    QRS Duration: 91 QT Interval:  410 QTC Calculation: 436 R Axis:   84 Text Interpretation:  Sinus rhythm Borderline right axis deviation Artifact Confirmed by Marthann Abshier  MD, Oather Muilenburg ([id number]) on 02/11/2016 7:32:44 PM       Radiology No results found.  Procedures Procedures (including critical care time)  Medications Ordered in ED Medications  0.9 %  sodium chloride infusion (not administered)  sodium chloride 0.9 % bolus 1,000 mL (1,000 mLs Intravenous New Bag/Given 02/11/16 2018)     Initial Impression / Assessment and Plan / ED Course  I have reviewed the triage vital signs and the nursing notes.  Pertinent labs & imaging results that were available during my care of the patient were reviewed by me and considered in my medical decision making (see chart for details).     Patient about 2 hours following donating plasma had cramping in both hands and tingling in both arms. Patient also felt weak. No focal neuro deficits. Labs here were normal other than calcium being slightly low but not enough to result in the symptoms. Suspect it had something to do with some dehydration following giving the blood. Patient after IV fluids feels fine neuro exam is normal patient nontoxic no acute distress.  In addition patient blood pressure was originally have fairly low here was in the in mid 90s never below 90 following the fluids it was back up into the low 100s. Patient has a history of hypertension is on hypertensive meds so could've been secondary to find depletion.  In addition potassium was slightly low at 3.3 but not clinically  significant.  Final Clinical Impressions(s) / ED Diagnoses   Final diagnoses:  Weakness  Dehydration    New Prescriptions New Prescriptions   No medications on file     [name], MD 02/11/16 2218

## 2016-02-11 NOTE — Discharge Instructions (Signed)
Today's workup without any acute findings. May have been secondary to dehydration. Take it easy tomorrow. Return for any new or worse symptoms. Follow-up with your doctor as needed.

## 2016-04-24 ENCOUNTER — Encounter: Payer: Self-pay | Admitting: Gastroenterology

## 2016-05-08 ENCOUNTER — Ambulatory Visit (AMBULATORY_SURGERY_CENTER): Payer: Self-pay

## 2016-05-08 VITALS — Ht 62.0 in | Wt 134.4 lb

## 2016-05-08 DIAGNOSIS — Z1211 Encounter for screening for malignant neoplasm of colon: Secondary | ICD-10-CM

## 2016-05-08 MED ORDER — NA SULFATE-K SULFATE-MG SULF 17.5-3.13-1.6 GM/177ML PO SOLN: 1.0000 | Freq: Once | ORAL | 0 refills | Status: AC

## 2016-05-08 NOTE — Progress Notes (Signed)
Denies allergies to eggs or soy products. Denies complication of anesthesia or sedation. Denies use of weight loss medication. Denies use of O2.   Emmi instructions declined. Patient doesn't use a computer.

## 2016-05-18 ENCOUNTER — Other Ambulatory Visit: Payer: Self-pay | Admitting: Internal Medicine

## 2016-05-18 DIAGNOSIS — E2839 Other primary ovarian failure: Secondary | ICD-10-CM

## 2016-05-22 ENCOUNTER — Encounter: Payer: BLUE CROSS/BLUE SHIELD | Admitting: Gastroenterology

## 2016-06-12 ENCOUNTER — Other Ambulatory Visit: Payer: BLUE CROSS/BLUE SHIELD

## 2016-12-28 ENCOUNTER — Emergency Department (HOSPITAL_COMMUNITY)
Admission: EM | Admit: 2016-12-28 | Discharge: 2016-12-28 | Disposition: A | Discharge status: Home / Self Care | Payer: BLUE CROSS/BLUE SHIELD | Attending: Emergency Medicine | Admitting: Emergency Medicine

## 2016-12-28 ENCOUNTER — Emergency Department (HOSPITAL_COMMUNITY): Payer: BLUE CROSS/BLUE SHIELD

## 2016-12-28 ENCOUNTER — Encounter (HOSPITAL_COMMUNITY): Payer: Self-pay

## 2016-12-28 DIAGNOSIS — I10 Essential (primary) hypertension: Secondary | ICD-10-CM | POA: Insufficient documentation

## 2016-12-28 DIAGNOSIS — M7918 Myalgia, other site: Secondary | ICD-10-CM | POA: Diagnosis present

## 2016-12-28 DIAGNOSIS — R6889 Other general symptoms and signs: Secondary | ICD-10-CM | POA: Diagnosis not present

## 2016-12-28 DIAGNOSIS — J40 Bronchitis, not specified as acute or chronic: Secondary | ICD-10-CM | POA: Insufficient documentation

## 2016-12-28 LAB — CBC WITH DIFFERENTIAL/PLATELET
BASOS ABS: 0 10*3/uL (ref 0.0–0.1)
Basophils Relative: 1 %
EOS ABS: 0 10*3/uL (ref 0.0–0.7)
Eosinophils Relative: 1 %
HCT: 39.8 % (ref 36.0–46.0)
Hemoglobin: 12.8 g/dL (ref 12.0–15.0)
LYMPHS ABS: 2 10*3/uL (ref 0.7–4.0)
Lymphocytes Relative: 57 %
MCH: 26.3 pg (ref 26.0–34.0)
MCHC: 32.2 g/dL (ref 30.0–36.0)
MCV: 81.7 fL (ref 78.0–100.0)
MONO ABS: 0.2 10*3/uL (ref 0.1–1.0)
Monocytes Relative: 7 %
Neutro Abs: 1.1 10*3/uL — ABNORMAL LOW (ref 1.7–7.7)
Neutrophils Relative %: 34 %
PLATELETS: 276 10*3/uL (ref 150–400)
RBC: 4.87 MIL/uL (ref 3.87–5.11)
RDW: 14.3 % (ref 11.5–15.5)
WBC: 3.3 10*3/uL — AB (ref 4.0–10.5)

## 2016-12-28 LAB — BASIC METABOLIC PANEL
Anion gap: 8 (ref 5–15)
BUN: 11 mg/dL (ref 6–20)
CO2: 24 mmol/L (ref 22–32)
Calcium: 8.3 mg/dL — ABNORMAL LOW (ref 8.9–10.3)
Chloride: 105 mmol/L (ref 101–111)
Creatinine, Ser: 0.62 mg/dL (ref 0.44–1.00)
GFR calc Af Amer: >60 mL/min (ref >60)
GFR calc non Af Amer: >60 mL/min (ref >60)
Glucose, Bld: 151 mg/dL — ABNORMAL HIGH (ref 65–99)
Potassium: 3.6 mmol/L (ref 3.5–5.1)
Sodium: 137 mmol/L (ref 135–145)

## 2016-12-28 LAB — URINALYSIS, ROUTINE W REFLEX MICROSCOPIC
Bilirubin Urine: NEGATIVE
GLUCOSE, UA: NEGATIVE mg/dL
Hgb urine dipstick: NEGATIVE
KETONES UR: 5 mg/dL — AB
LEUKOCYTES UA: NEGATIVE
NITRITE: NEGATIVE
PROTEIN: NEGATIVE mg/dL
Specific Gravity, Urine: 1.027 (ref 1.005–1.030)
pH: 5 (ref 5.0–8.0)

## 2016-12-28 MED ORDER — LEVOFLOXACIN 750 MG PO TABS: 750.0000 mg | ORAL_TABLET | Freq: Every day | ORAL | 0 refills | Status: AC

## 2016-12-28 MED ORDER — ACETAMINOPHEN 500 MG PO TABS
1000.0000 mg | ORAL_TABLET | Freq: Once | ORAL | Status: AC
  Administered 2016-12-28: 1000 mg via ORAL
  Filled 2016-12-28: qty 2

## 2016-12-28 MED ORDER — LEVOFLOXACIN 750 MG PO TABS
750.0000 mg | ORAL_TABLET | Freq: Once | ORAL | Status: AC
  Administered 2016-12-28: 750 mg via ORAL
  Filled 2016-12-28: qty 1

## 2016-12-28 MED ORDER — NAPROXEN 375 MG PO TABS: 375.0000 mg | ORAL_TABLET | Freq: Two times a day (BID) | ORAL | 0 refills | Status: AC | PRN

## 2016-12-28 MED ORDER — AEROCHAMBER PLUS FLO-VU LARGE MISC: 1.0000 | Freq: Once | Status: DC

## 2016-12-28 MED ORDER — IBUPROFEN 200 MG PO TABS
600.0000 mg | ORAL_TABLET | Freq: Once | ORAL | Status: AC
  Administered 2016-12-28: 600 mg via ORAL
  Filled 2016-12-28: qty 1

## 2016-12-28 MED ORDER — PREDNISONE 20 MG PO TABS: 40.0000 mg | ORAL_TABLET | Freq: Every day | ORAL | 0 refills | Status: AC

## 2016-12-28 MED ORDER — PREDNISONE 20 MG PO TABS
40.0000 mg | ORAL_TABLET | Freq: Once | ORAL | Status: AC
  Administered 2016-12-28: 40 mg via ORAL
  Filled 2016-12-28: qty 2

## 2016-12-28 MED ORDER — ALBUTEROL SULFATE HFA 108 (90 BASE) MCG/ACT IN AERS
2.0000 | INHALATION_SPRAY | Freq: Once | RESPIRATORY_TRACT | Status: AC
  Administered 2016-12-28: 2 via RESPIRATORY_TRACT
  Filled 2016-12-28: qty 6.7

## 2016-12-28 MED ORDER — BENZONATATE 100 MG PO CAPS: 100.0000 mg | ORAL_CAPSULE | Freq: Three times a day (TID) | ORAL | 0 refills | Status: DC | PRN

## 2016-12-28 NOTE — ED Provider Notes (Signed)
[place] EMERGENCY DEPARTMENT Provider Note   CSN: [id number] Arrival date & time: 12/28/16  1755     History   Chief Complaint No chief complaint on file.   HPI Krystal Houston is a 61 y.o. female.  HPI   61 year old female with past medical history as below here with generalized body aches.  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  The patient recently had a pneumonia.  The patient also had a tacky recent was diagnosed with flu.  In the last several days, she has had diffuse body aches, cough, chills.  She is also had mild dysuria.  No flank pain.  She has had mild nausea and decreased appetite, but has been forcing herself to eat and drink.  She has had subjective fevers.  She is bringing up yellow-green sputum.  She is also had occasional wheezing.  No current shortness of breath.  No current chest pain.  Her myalgias are bilateral and seem to come and go randomly.  She is not take any medications for this.  Past Medical History:  Diagnosis Date  . Brain aneurysm   . Hyperlipidemia   . Hypertension    Patient denies  . Leukocytosis    she reports she has had this her whole life, on labs back to 2009    Patient Active Problem List   Diagnosis Date Noted  . HYPERLIPIDEMIA 05/22/2007  . Essential hypertension 05/22/2007  . CEREBRAL ANEURYSM 05/22/2007    Past Surgical History:  Procedure Laterality Date  . ABDOMINAL HYSTERECTOMY    . CEREBRAL ANEURYSM REPAIR    . vaginal deliveries     x 3    OB History    No data available       Home Medications    Prior to Admission medications   Medication Sig Start Date End Date Taking? Authorizing Provider  benzonatate (TESSALON) 100 MG capsule Take 1 capsule (100 mg total) by mouth 3 (three) times daily as needed for cough. 12/28/16   Duffy Bruce, MD  levofloxacin (LEVAQUIN) 750 MG tablet Take 1 tablet (750 mg total) by mouth daily for 5 days. 12/28/16  01/02/17  Duffy Bruce, MD  lisinopril (PRINIVIL,ZESTRIL) 10 MG tablet TAKE 1 TABLET (10 MG TOTAL) BY MOUTH DAILY. Patient not taking: Reported on 12/28/2016 10/19/14   Kennyth Arnold, FNP  naproxen (NAPROSYN) 375 MG tablet Take 1 tablet (375 mg total) by mouth 2 (two) times daily as needed for up to 7 days for moderate pain. 12/28/16 01/04/17  Duffy Bruce, MD  predniSONE (DELTASONE) 20 MG tablet Take 2 tablets (40 mg total) by mouth daily for 5 days. 12/28/16 01/02/17  Duffy Bruce, MD    Family History Family History  Problem Relation Age of Onset  . Heart disease Mother   . Diabetes Father   . Coronary artery disease Other   . Diabetes Other   . Colon cancer Neg Hx   . Esophageal cancer Neg Hx   . Rectal cancer Neg Hx   . Stomach cancer Neg Hx     Social History Social History   Tobacco Use  . Smoking status: Never Smoker  . Smokeless tobacco: Never Used  Substance Use Topics  . Alcohol use: No  . Drug use: No     Allergies   Sulfamethoxazole   Review of Systems Review of Systems  Constitutional: Positive for fatigue.  Respiratory: Positive for cough and wheezing.  Gastrointestinal: Positive for nausea.  Musculoskeletal: Positive for arthralgias and myalgias.  All other systems reviewed and are negative.    Physical Exam Updated Vital Signs BP 124/79   Pulse 69   Temp 97.7 F (36.5 C) (Oral)   Resp 15   Ht 5\' 2"  (1.575 m)   Wt 63.5 kg (140 lb)   SpO2 100%   BMI 25.61 kg/m   Physical Exam  Constitutional: She is oriented to person, place, and time. She appears well-developed and well-nourished. No distress.  HENT:  Head: Normocephalic and atraumatic.  Nasal mucosal edema and congestion.  Mild posterior pharyngeal erythema.  Eyes: Conjunctivae are normal.  Neck: Neck supple.  Cardiovascular: Normal rate, regular rhythm and normal heart sounds. Exam reveals no friction rub.  No murmur heard. Pulmonary/Chest: Effort normal. No respiratory  distress. She has wheezes (Scant, and expiratory). She has no rales.  Abdominal: She exhibits no distension.  Musculoskeletal: She exhibits no edema.  Neurological: She is alert and oriented to person, place, and time. She exhibits normal muscle tone.  Skin: Skin is warm. Capillary refill takes less than 2 seconds.  Psychiatric: She has a normal mood and affect.  Nursing note and vitals reviewed.    ED Treatments / Results  Labs (all labs ordered are listed, but only abnormal results are displayed) Labs Reviewed  CBC WITH DIFFERENTIAL/PLATELET - Abnormal; Notable for the following components:      Result Value   WBC 3.3 (*)    Neutro Abs 1.1 (*)    All other components within normal limits  BASIC METABOLIC PANEL - Abnormal; Notable for the following components:   Glucose, Bld 151 (*)    Calcium 8.3 (*)    All other components within normal limits  URINALYSIS, ROUTINE W REFLEX MICROSCOPIC - Abnormal; Notable for the following components:   Ketones, ur 5 (*)    All other components within normal limits    EKG  EKG Interpretation None       Radiology Dg Chest 2 View  Result Date: 12/28/2016 CLINICAL DATA:  Fever, chills, and productive cough for 5 days, coughing up yellow phlegm, history hypertension EXAM: CHEST  2 VIEW COMPARISON:  None FINDINGS: Normal heart size, mediastinal contours, and pulmonary vascularity. Atherosclerotic calcification aorta. Lungs hyperinflated with central peribronchial thickening. No acute infiltrate, pleural effusion, or pneumothorax. Osseous demineralization. IMPRESSION: Peribronchial thickening and hyperinflation which could reflect asthma or COPD. No acute infiltrate. Electronically Signed   By: Lavonia Dana M.D.   On: 12/28/2016 18:58    Procedures Procedures (including critical care time)  Medications Ordered in ED Medications  AEROCHAMBER PLUS FLO-VU LARGE MISC 1 each (1 each Other Not Given 12/28/16 2159)  albuterol (PROVENTIL  HFA;VENTOLIN HFA) 108 (90 Base) MCG/ACT inhaler 2 puff (2 puffs Inhalation Given 12/28/16 2201)  predniSONE (DELTASONE) tablet 40 mg (40 mg Oral Given 12/28/16 2201)  ibuprofen (ADVIL,MOTRIN) tablet 600 mg (600 mg Oral Given 12/28/16 2228)  acetaminophen (TYLENOL) tablet 1,000 mg (1,000 mg Oral Given 12/28/16 2228)  levofloxacin (LEVAQUIN) tablet 750 mg (750 mg Oral Given 12/28/16 2255)     Initial Impression / Assessment and Plan / ED Course  I have reviewed the triage vital signs and the nursing notes.  Pertinent labs & imaging results that were available during my care of the patient were reviewed by me and considered in my medical decision making (see chart for details).     61 yo F here with diffuse myalgias, cough in setting of known  sick contacts. Suspect viral URI with possible early concomitant PNA. She has multiple sick contacts. She is o/w afebrile, well appearing and in NAD. Abdomen soft, NT, and ND. CXR unremarkable. Labs reassuring. She does have some wheezing on exam and CXR shows asthma/COPD - will give prednisone, AbX given sputum production, and albuterol. Good return precautions given. No signs of sepsis.  Final Clinical Impressions(s) / ED Diagnoses   Final diagnoses:  Bronchitis  Flu-like symptoms    ED Discharge Orders        Ordered    levofloxacin (LEVAQUIN) 750 MG tablet  Daily     12/28/16 2319    predniSONE (DELTASONE) 20 MG tablet  Daily     12/28/16 2319    naproxen (NAPROSYN) 375 MG tablet  2 times daily PRN     12/28/16 2319    benzonatate (TESSALON) 100 MG capsule  3 times daily PRN     12/28/16 2319       Duffy Bruce, MD 12/29/16 0120

## 2016-12-28 NOTE — ED Triage Notes (Addendum)
Pt presents with 5 day h/o flu-like symptoms.  Pt reports productive cough with yellow phlegm, chills and fever, reports generalized body aches.

## 2016-12-28 NOTE — Discharge Instructions (Signed)
Drink plenty of fluids, at least 6-8 glasses of water per day  Use your inhaler every 4 hours as needed for cough, wheezing, or shortness of breath

## 2016-12-28 NOTE — ED Notes (Signed)
Pt departed in NAD, escorted in wheelchair to front lobby by Bond, NT.

## 2016-12-29 ENCOUNTER — Other Ambulatory Visit: Payer: Self-pay | Admitting: Internal Medicine

## 2016-12-29 DIAGNOSIS — Z1231 Encounter for screening mammogram for malignant neoplasm of breast: Secondary | ICD-10-CM

## 2017-01-29 ENCOUNTER — Ambulatory Visit
Admission: RE | Admit: 2017-01-29 | Discharge: 2017-01-29 | Disposition: A | Discharge status: Home / Self Care | Payer: BLUE CROSS/BLUE SHIELD | Source: Ambulatory Visit | Attending: Internal Medicine | Admitting: Internal Medicine

## 2017-01-29 DIAGNOSIS — Z1231 Encounter for screening mammogram for malignant neoplasm of breast: Secondary | ICD-10-CM

## 2017-01-29 DIAGNOSIS — E2839 Other primary ovarian failure: Secondary | ICD-10-CM

## 2017-01-30 ENCOUNTER — Other Ambulatory Visit: Payer: Self-pay | Admitting: Internal Medicine

## 2017-01-30 DIAGNOSIS — R928 Other abnormal and inconclusive findings on diagnostic imaging of breast: Secondary | ICD-10-CM

## 2017-02-05 ENCOUNTER — Ambulatory Visit
Admission: RE | Admit: 2017-02-05 | Discharge: 2017-02-05 | Disposition: A | Discharge status: Home / Self Care | Payer: BLUE CROSS/BLUE SHIELD | Source: Ambulatory Visit | Attending: Internal Medicine | Admitting: Internal Medicine

## 2017-02-05 ENCOUNTER — Ambulatory Visit: Payer: BLUE CROSS/BLUE SHIELD

## 2017-02-05 DIAGNOSIS — R928 Other abnormal and inconclusive findings on diagnostic imaging of breast: Secondary | ICD-10-CM

## 2018-07-30 DIAGNOSIS — E559 Vitamin D deficiency, unspecified: Secondary | ICD-10-CM | POA: Diagnosis not present

## 2018-07-30 DIAGNOSIS — G4700 Insomnia, unspecified: Secondary | ICD-10-CM | POA: Diagnosis not present

## 2018-07-30 DIAGNOSIS — I10 Essential (primary) hypertension: Secondary | ICD-10-CM | POA: Diagnosis not present

## 2018-07-30 DIAGNOSIS — Z0000 Encounter for general adult medical examination without abnormal findings: Secondary | ICD-10-CM | POA: Diagnosis not present

## 2018-07-30 DIAGNOSIS — Z131 Encounter for screening for diabetes mellitus: Secondary | ICD-10-CM | POA: Diagnosis not present

## 2018-07-30 DIAGNOSIS — E7849 Other hyperlipidemia: Secondary | ICD-10-CM | POA: Diagnosis not present

## 2018-08-19 ENCOUNTER — Other Ambulatory Visit: Payer: Self-pay | Admitting: Internal Medicine

## 2018-08-19 DIAGNOSIS — Z1231 Encounter for screening mammogram for malignant neoplasm of breast: Secondary | ICD-10-CM

## 2018-09-06 DIAGNOSIS — Z681 Body mass index (BMI) 19 or less, adult: Secondary | ICD-10-CM | POA: Diagnosis not present

## 2018-09-06 DIAGNOSIS — Z0001 Encounter for general adult medical examination with abnormal findings: Secondary | ICD-10-CM | POA: Diagnosis not present

## 2018-09-06 DIAGNOSIS — Z1389 Encounter for screening for other disorder: Secondary | ICD-10-CM | POA: Diagnosis not present

## 2018-09-06 DIAGNOSIS — I10 Essential (primary) hypertension: Secondary | ICD-10-CM | POA: Diagnosis not present

## 2018-09-10 DIAGNOSIS — R7309 Other abnormal glucose: Secondary | ICD-10-CM | POA: Diagnosis not present

## 2018-09-10 DIAGNOSIS — Z1389 Encounter for screening for other disorder: Secondary | ICD-10-CM | POA: Diagnosis not present

## 2018-09-10 DIAGNOSIS — Z681 Body mass index (BMI) 19 or less, adult: Secondary | ICD-10-CM | POA: Diagnosis not present

## 2018-09-20 DIAGNOSIS — E7849 Other hyperlipidemia: Secondary | ICD-10-CM | POA: Diagnosis not present

## 2018-09-20 DIAGNOSIS — R7309 Other abnormal glucose: Secondary | ICD-10-CM | POA: Diagnosis not present

## 2018-09-27 ENCOUNTER — Ambulatory Visit: Payer: BLUE CROSS/BLUE SHIELD

## 2019-03-13 DIAGNOSIS — Z23 Encounter for immunization: Secondary | ICD-10-CM | POA: Diagnosis not present

## 2019-04-12 DIAGNOSIS — Z23 Encounter for immunization: Secondary | ICD-10-CM | POA: Diagnosis not present

## 2019-04-18 ENCOUNTER — Ambulatory Visit: Payer: Self-pay | Admitting: Medical

## 2019-05-26 ENCOUNTER — Ambulatory Visit
Admission: RE | Admit: 2019-05-26 | Discharge: 2019-05-26 | Disposition: A | Discharge status: Home / Self Care | Payer: Self-pay | Source: Ambulatory Visit | Attending: Internal Medicine | Admitting: Internal Medicine

## 2019-05-26 ENCOUNTER — Other Ambulatory Visit: Payer: Self-pay

## 2019-05-26 DIAGNOSIS — Z1231 Encounter for screening mammogram for malignant neoplasm of breast: Secondary | ICD-10-CM

## 2019-07-22 ENCOUNTER — Other Ambulatory Visit (HOSPITAL_COMMUNITY): Payer: Self-pay | Admitting: Interventional Radiology

## 2019-07-22 DIAGNOSIS — R519 Headache, unspecified: Secondary | ICD-10-CM

## 2019-07-22 DIAGNOSIS — I671 Cerebral aneurysm, nonruptured: Secondary | ICD-10-CM

## 2019-07-28 ENCOUNTER — Ambulatory Visit (HOSPITAL_COMMUNITY): Payer: Self-pay

## 2019-08-07 ENCOUNTER — Other Ambulatory Visit: Payer: Self-pay

## 2019-08-07 ENCOUNTER — Ambulatory Visit (HOSPITAL_COMMUNITY)
Admission: RE | Admit: 2019-08-07 | Discharge: 2019-08-07 | Disposition: A | Discharge status: Home / Self Care | Payer: BC Managed Care – PPO | Source: Ambulatory Visit | Attending: Interventional Radiology | Admitting: Interventional Radiology

## 2019-08-07 DIAGNOSIS — I671 Cerebral aneurysm, nonruptured: Secondary | ICD-10-CM

## 2019-08-07 DIAGNOSIS — R519 Headache, unspecified: Secondary | ICD-10-CM

## 2019-08-08 ENCOUNTER — Other Ambulatory Visit (HOSPITAL_COMMUNITY): Payer: Self-pay | Admitting: Interventional Radiology

## 2019-08-08 DIAGNOSIS — I671 Cerebral aneurysm, nonruptured: Secondary | ICD-10-CM

## 2019-08-08 NOTE — H&P (View-Only) (Signed)
Chief Complaint: Patient was seen in consultation today for follow-up regarding an previously treated intracranial aneurysms.  Referring Physician(s): Kymere Fullington  Supervising Physician:History of Present Illness: Krystal Houston is a 64 y.o. 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  She subsequently had follow-up diagnostic arteriograms the most recent being on February 14, 2012, which continued to show stability of the intracranial aneurysms.  She was subsequently lost to follow-up.  She returns today referred by her primary care with  symptoms of memory difficulties mostly short-term which she says has been progressive for the last 5 years.  She describes her symptoms as being mostly being unable to remember recent events and conversations or telephone numbers..  She also reports having difficulty with instructions at her job.  More recently she describes episodes of a left sided temporoparietal pain which is very momentary and lasted only few seconds without any associated symptoms of nausea vomiting photophobia, diplopia, visual blurring, or speech difficulties.  She denies symptoms of nausea vomiting loss of consciousness, seizure-like activity, gait instability, swallowing difficulties, hearing problems, or taste of food..  She denies any focal or generalized weakness.  She denies  symptoms of fever chills or rigors.  She denies any chest pain shortness of breath or palpitations.  She also denies symptoms of  coughing sputum production or wheezing.  Her appetite is normal her weight is steady.  Denies any abdominal pains, constipation  diarrhea or melena.  She denies symptoms of dysuria frequency all for hematuria.  She is able to sleep at night and denies any mood changes or affect changes..       Past Medical History:  Diagnosis Date  . Brain aneurysm   . Hyperlipidemia   . Hypertension    Patient denies  . Leukocytosis    she reports she has had this her whole life, on labs back to 2009    Past Surgical History:  Procedure Laterality Date  . ABDOMINAL HYSTERECTOMY    . CEREBRAL ANEURYSM REPAIR    . vaginal deliveries     x 3    Allergies: Sulfamethoxazole  Medications: Prior to Admission medications   Medication Sig Start Date End Date Taking? Authorizing Provider  benzonatate (TESSALON) 100 MG capsule Take 1 capsule (100 mg total) by mouth 3 (three) times daily as needed for cough. 12/28/16   Duffy Bruce, MD  lisinopril (PRINIVIL,ZESTRIL) 10 MG tablet TAKE 1 TABLET (10 MG TOTAL) BY MOUTH DAILY. Patient not taking: Reported on 12/28/2016 10/19/14   Kennyth Arnold, FNP     Family History  Problem Relation Age of Onset  . Heart disease Mother   . Diabetes Father   . Coronary artery disease Other   . Diabetes Other   . Colon cancer Neg Hx   . Esophageal cancer Neg Hx   . Rectal cancer Neg Hx   . Stomach cancer Neg Hx     Social History   Socioeconomic History  . Marital status: Legally Separated    Spouse name: Not on file  . Number of children: Not on file  . Years of education: Not on file  . Highest education level: Not on file  Occupational History  . Not on file  Tobacco Use  . Smoking status: Never Smoker  . Smokeless tobacco: Never Used  Substance and Sexual Activity  . Alcohol use: No  . Drug use: No  . Sexual activity: Not on file  Other Topics Concern  . Not on file  Social History Narrative  . Not on file   Social Determinants of Health   Financial Resource Strain:   . Difficulty of Paying Living Expenses:   Food Insecurity:   . Worried About Ship broker in the Last Year:   . Arboriculturist in the Last Year:   Transportation Needs:   . Film/video editor (Medical):   Marland Kitchen Lack of Transportation (Non-Medical):   Physical Activity:   . Days of Exercise per Week:   . Minutes of Exercise per Session:   Stress:   . Feeling of Stress :   Social Connections:   . Frequency of Communication with Friends and Family:   . Frequency of Social Gatherings with Friends and Family:   . Attends Religious Services:   . Active Member of Clubs or Organizations:   . Attends Archivist Meetings:   Marland Kitchen Marital Status:      Review of Systems: A 12 point ROS discussed and pertinent positives are indicated in the HPI above.  All other systems are negative.  Review of Systems  As above  Vital Signs: There were no vitals taken for this visit.  Physical Exam  Alert awake oriented to time place space.  Affect normal.  Speaking comprehension intact.  No gross vision or visual field abnormalities.  No facial asymmetry.  Tongue in the midline.  Motor weakness or gait instability.  Coordination grossly intact.     Imaging: No results found.  Labs:  CBC: No results for input(s): WBC, HGB, HCT, PLT in the last 8760 hours.  COAGS: No results for input(s): INR, APTT in the last 8760 hours.  BMP: No results for input(s): NA, K, CL, CO2, GLUCOSE, BUN, CALCIUM, CREATININE, GFRNONAA, GFRAA in the last 8760 hours.  Invalid input(s): CMP  LIVER FUNCTION TESTS: No results for input(s): BILITOT, AST, ALT, ALKPHOS, PROT, ALBUMIN in the last 8760 hours.  TUMOR MARKERS: No results for input(s): AFPTM, CEA, CA199, CHROMGRNA in the last 8760 hours.  Assessment and Plan: Is unclear whether patient's symptoms are related to her previous aneurysms.  However she has had no follow-up in regards assessing the stability of the treatment of these aneurysms.  It was felt the patient needs a Foley cath arteriogram to accurately evaluate the  status output he previously treated adequately aneurysms. The patient is aware of the procedure having had these previously.    Plan for follow-up  Schedule a diagnostic cath arteriogram for follow-up evaluation of previously treated intracranial aneurysms. Informed patient that our schedulers will call to schedule the test  All questions answered and concerns addressed. Patient conveys understanding and agrees with plan.  Thank you for this interesting consult.  I greatly enjoyed meeting [facility] and look forward to participating in their care.  A copy of this report was sent to the requesting provider on this date.   Electronically Signed: Rob Hickman, MD 08/08/2019, 2:12 PM   I spent a total of  in face to face in clinical consultation, greater than 50% of which was counseling/coordinating care for her present neurological symptoms, and regarding management of previously treated apical aneurysms.

## 2019-08-08 NOTE — Consult Note (Signed)
Chief Complaint: Patient was seen in consultation today for follow-up regarding an previously treated intracranial aneurysms.  Referring Physician(s): Cherika Jessie  Supervising Physician:History of Present Illness: Krystal Houston is a 64 y.o. 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  She subsequently had follow-up diagnostic arteriograms the most recent being on February 14, 2012, which continued to show stability of the intracranial aneurysms.  She was subsequently lost to follow-up.  She returns today referred by her primary care with  symptoms of memory difficulties mostly short-term which she says has been progressive for the last 5 years.  She describes her symptoms as being mostly being unable to remember recent events and conversations or telephone numbers..  She also reports having difficulty with instructions at her job.  More recently she describes episodes of a left sided temporoparietal pain which is very momentary and lasted only few seconds without any associated symptoms of nausea vomiting photophobia, diplopia, visual blurring, or speech difficulties.  She denies symptoms of nausea vomiting loss of consciousness, seizure-like activity, gait instability, swallowing difficulties, hearing problems, or taste of food..  She denies any focal or generalized weakness.  She denies  symptoms of fever chills or rigors.  She denies any chest pain shortness of breath or palpitations.  She also denies symptoms of  coughing sputum production or wheezing.  Her appetite is normal her weight is steady.  Denies any abdominal pains, constipation  diarrhea or melena.  She denies symptoms of dysuria frequency all for hematuria.  She is able to sleep at night and denies any mood changes or affect changes..       Past Medical History:  Diagnosis Date  . Brain aneurysm   . Hyperlipidemia   . Hypertension    Patient denies  . Leukocytosis    she reports she has had this her whole life, on labs back to 2009    Past Surgical History:  Procedure Laterality Date  . ABDOMINAL HYSTERECTOMY    . CEREBRAL ANEURYSM REPAIR    . vaginal deliveries     x 3    Allergies: Sulfamethoxazole  Medications: Prior to Admission medications   Medication Sig Start Date End Date Taking? Authorizing Provider  benzonatate (TESSALON) 100 MG capsule Take 1 capsule (100 mg total) by mouth 3 (three) times daily as needed for cough. 12/28/16   Duffy Bruce, MD  lisinopril (PRINIVIL,ZESTRIL) 10 MG tablet TAKE 1 TABLET (10 MG TOTAL) BY MOUTH DAILY. Patient not taking: Reported on 12/28/2016 10/19/14   Kennyth Arnold, FNP     Family History  Problem Relation Age of Onset  . Heart disease Mother   . Diabetes Father   . Coronary artery disease Other   . Diabetes Other   . Colon cancer Neg Hx   . Esophageal cancer Neg Hx   . Rectal cancer Neg Hx   . Stomach cancer Neg Hx     Social History   Socioeconomic History  . Marital status: Legally Separated    Spouse name: Not on file  . Number of children: Not on file  . Years of education: Not on file  . Highest education level: Not on file  Occupational History  . Not on file  Tobacco Use  . Smoking status: Never Smoker  . Smokeless tobacco: Never Used  Substance and Sexual Activity  . Alcohol use: No  . Drug use: No  . Sexual activity: Not on file  Other Topics Concern  . Not on file  Social History Narrative  . Not on file   Social Determinants of Health   Financial Resource Strain:   . Difficulty of Paying Living Expenses:   Food Insecurity:   . Worried About Ship broker in the Last Year:   . Arboriculturist in the Last Year:   Transportation Needs:   . Film/video editor (Medical):   Marland Kitchen Lack of Transportation (Non-Medical):   Physical Activity:   . Days of Exercise per Week:   . Minutes of Exercise per Session:   Stress:   . Feeling of Stress :   Social Connections:   . Frequency of Communication with Friends and Family:   . Frequency of Social Gatherings with Friends and Family:   . Attends Religious Services:   . Active Member of Clubs or Organizations:   . Attends Archivist Meetings:   Marland Kitchen Marital Status:      Review of Systems: A 12 point ROS discussed and pertinent positives are indicated in the HPI above.  All other systems are negative.  Review of Systems  As above  Vital Signs: There were no vitals taken for this visit.  Physical Exam  Alert awake oriented to time place space.  Affect normal.  Speaking comprehension intact.  No gross vision or visual field abnormalities.  No facial asymmetry.  Tongue in the midline.  Motor weakness or gait instability.  Coordination grossly intact.     Imaging: No results found.  Labs:  CBC: No results for input(s): WBC, HGB, HCT, PLT in the last 8760 hours.  COAGS: No results for input(s): INR, APTT in the last 8760 hours.  BMP: No results for input(s): NA, K, CL, CO2, GLUCOSE, BUN, CALCIUM, CREATININE, GFRNONAA, GFRAA in the last 8760 hours.  Invalid input(s): CMP  LIVER FUNCTION TESTS: No results for input(s): BILITOT, AST, ALT, ALKPHOS, PROT, ALBUMIN in the last 8760 hours.  TUMOR MARKERS: No results for input(s): AFPTM, CEA, CA199, CHROMGRNA in the last 8760 hours.  Assessment and Plan: Is unclear whether patient's symptoms are related to her previous aneurysms.  However she has had no follow-up in regards assessing the stability of the treatment of these aneurysms.  It was felt the patient needs a Foley cath arteriogram to accurately evaluate the  status output he previously treated adequately aneurysms. The patient is aware of the procedure having had these previously.    Plan for follow-up  Schedule a diagnostic cath arteriogram for follow-up evaluation of previously treated intracranial aneurysms. Informed patient that our schedulers will call to schedule the test  All questions answered and concerns addressed. Patient conveys understanding and agrees with plan.  Thank you for this interesting consult.  I greatly enjoyed meeting [facility] and look forward to participating in their care.  A copy of this report was sent to the requesting provider on this date.   Electronically Signed: Rob Hickman, MD 08/08/2019, 2:12 PM   I spent a total of  in face to face in clinical consultation, greater than 50% of which was counseling/coordinating care for her present neurological symptoms, and regarding management of previously treated apical aneurysms.

## 2019-08-11 ENCOUNTER — Telehealth (HOSPITAL_COMMUNITY): Payer: Self-pay

## 2019-08-11 NOTE — Telephone Encounter (Signed)
Called to schedule diagnostic angiogram, no answer, left vm. AW  

## 2019-08-18 ENCOUNTER — Other Ambulatory Visit: Payer: Self-pay | Admitting: Physician Assistant

## 2019-08-19 ENCOUNTER — Other Ambulatory Visit (HOSPITAL_COMMUNITY): Payer: Self-pay | Admitting: Interventional Radiology

## 2019-08-19 ENCOUNTER — Encounter (HOSPITAL_COMMUNITY): Payer: Self-pay

## 2019-08-19 ENCOUNTER — Ambulatory Visit (HOSPITAL_COMMUNITY)
Admission: RE | Admit: 2019-08-19 | Discharge: 2019-08-19 | Disposition: A | Discharge status: Home / Self Care | Payer: BC Managed Care – PPO | Source: Ambulatory Visit | Attending: Interventional Radiology | Admitting: Interventional Radiology

## 2019-08-19 ENCOUNTER — Other Ambulatory Visit: Payer: Self-pay

## 2019-08-19 DIAGNOSIS — I671 Cerebral aneurysm, nonruptured: Secondary | ICD-10-CM | POA: Diagnosis not present

## 2019-08-19 DIAGNOSIS — I10 Essential (primary) hypertension: Secondary | ICD-10-CM | POA: Diagnosis not present

## 2019-08-19 DIAGNOSIS — Z882 Allergy status to sulfonamides status: Secondary | ICD-10-CM | POA: Diagnosis not present

## 2019-08-19 DIAGNOSIS — Z8249 Family history of ischemic heart disease and other diseases of the circulatory system: Secondary | ICD-10-CM | POA: Diagnosis not present

## 2019-08-19 DIAGNOSIS — Z90710 Acquired absence of both cervix and uterus: Secondary | ICD-10-CM | POA: Insufficient documentation

## 2019-08-19 DIAGNOSIS — Z9889 Other specified postprocedural states: Secondary | ICD-10-CM | POA: Diagnosis not present

## 2019-08-19 DIAGNOSIS — E785 Hyperlipidemia, unspecified: Secondary | ICD-10-CM | POA: Insufficient documentation

## 2019-08-19 HISTORY — PX: IR ANGIO VERTEBRAL SEL SUBCLAVIAN INNOMINATE UNI R MOD SED: IMG5365

## 2019-08-19 HISTORY — PX: IR 3D INDEPENDENT WKST: IMG2385

## 2019-08-19 HISTORY — PX: IR ANGIO VERTEBRAL SEL VERTEBRAL UNI L MOD SED: IMG5367

## 2019-08-19 HISTORY — PX: IR ANGIO INTRA EXTRACRAN SEL COM CAROTID INNOMINATE BILAT MOD SED: IMG5360

## 2019-08-19 HISTORY — PX: IR US GUIDE VASC ACCESS RIGHT: IMG2390

## 2019-08-19 HISTORY — PX: IR ANGIO VERTEBRAL SEL VERTEBRAL BILAT MOD SED: IMG5369

## 2019-08-19 LAB — CBC
HCT: 40.2 % (ref 36.0–46.0)
Hemoglobin: 12.6 g/dL (ref 12.0–15.0)
MCH: 25.8 pg — ABNORMAL LOW (ref 26.0–34.0)
MCHC: 31.3 g/dL (ref 30.0–36.0)
MCV: 82.2 fL (ref 80.0–100.0)
Platelets: 286 10*3/uL (ref 150–400)
RBC: 4.89 MIL/uL (ref 3.87–5.11)
RDW: 13.8 % (ref 11.5–15.5)
WBC: 3.3 10*3/uL — ABNORMAL LOW (ref 4.0–10.5)
nRBC: 0 % (ref 0.0–0.2)

## 2019-08-19 LAB — BASIC METABOLIC PANEL
Anion gap: 9 (ref 5–15)
BUN: 6 mg/dL — ABNORMAL LOW (ref 8–23)
CO2: 25 mmol/L (ref 22–32)
Calcium: 9.1 mg/dL (ref 8.9–10.3)
Chloride: 108 mmol/L (ref 98–111)
Creatinine, Ser: 0.76 mg/dL (ref 0.44–1.00)
GFR calc Af Amer: >60 mL/min (ref >60)
GFR calc non Af Amer: >60 mL/min (ref >60)
Glucose, Bld: 105 mg/dL — ABNORMAL HIGH (ref 70–99)
Potassium: 3.6 mmol/L (ref 3.5–5.1)
Sodium: 142 mmol/L (ref 135–145)

## 2019-08-19 LAB — PROTIME-INR
INR: 0.9 (ref 0.8–1.2)
Prothrombin Time: 11.9 seconds (ref 11.4–15.2)

## 2019-08-19 MED ORDER — HEPARIN SODIUM (PORCINE) 1000 UNIT/ML IJ SOLN
INTRAMUSCULAR | Status: AC
  Filled 2019-08-19: qty 1

## 2019-08-19 MED ORDER — SODIUM CHLORIDE 0.9 % IV SOLN: INTRAVENOUS | Status: DC

## 2019-08-19 MED ORDER — VERAPAMIL HCL 2.5 MG/ML IV SOLN
INTRAVENOUS | Status: AC
  Filled 2019-08-19: qty 2

## 2019-08-19 MED ORDER — NITROGLYCERIN 1 MG/10 ML FOR IR/CATH LAB
INTRA_ARTERIAL | Status: AC
  Filled 2019-08-19: qty 10

## 2019-08-19 MED ORDER — IOHEXOL 300 MG/ML  SOLN
150.0000 mL | Freq: Once | INTRAMUSCULAR | Status: AC | PRN
  Administered 2019-08-19: 65 mL via INTRA_ARTERIAL

## 2019-08-19 MED ORDER — LIDOCAINE HCL 1 % IJ SOLN
INTRAMUSCULAR | Status: AC
  Filled 2019-08-19: qty 20

## 2019-08-19 MED ORDER — MIDAZOLAM HCL 2 MG/2ML IJ SOLN
INTRAMUSCULAR | Status: AC
  Filled 2019-08-19: qty 2

## 2019-08-19 MED ORDER — FENTANYL CITRATE (PF) 100 MCG/2ML IJ SOLN
INTRAMUSCULAR | Status: AC | PRN
  Administered 2019-08-19: 25 ug via INTRAVENOUS

## 2019-08-19 MED ORDER — ATROPINE SULFATE 1 MG/10ML IJ SOSY
PREFILLED_SYRINGE | INTRAMUSCULAR | Status: AC
  Filled 2019-08-19: qty 10

## 2019-08-19 MED ORDER — SODIUM CHLORIDE 0.9 % IV SOLN: INTRAVENOUS | Status: AC

## 2019-08-19 MED ORDER — FENTANYL CITRATE (PF) 100 MCG/2ML IJ SOLN
INTRAMUSCULAR | Status: AC
  Filled 2019-08-19: qty 2

## 2019-08-19 MED ORDER — IOHEXOL 300 MG/ML  SOLN
150.0000 mL | Freq: Once | INTRAMUSCULAR | Status: AC | PRN
  Administered 2019-08-19: 20 mL via INTRA_ARTERIAL

## 2019-08-19 MED ORDER — LIDOCAINE HCL (PF) 1 % IJ SOLN
INTRAMUSCULAR | Status: AC | PRN
  Administered 2019-08-19: 5 mL

## 2019-08-19 MED ORDER — MIDAZOLAM HCL 2 MG/2ML IJ SOLN
INTRAMUSCULAR | Status: AC | PRN
  Administered 2019-08-19: 1 mg via INTRAVENOUS

## 2019-08-19 NOTE — Procedures (Signed)
S/P 4 vessel cerebral arteriogram RT Rad approach. Findings. 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 S.Silva Aamodt MD

## 2019-08-19 NOTE — Sedation Documentation (Signed)
O2/ETCO2 monitor removed per MD

## 2019-08-19 NOTE — Discharge Instructions (Signed)
Drink plenty of fluids for 48 hours and keep wrist elevated at heart level for 24 hours  Radial Site Care   This sheet gives you information about how to care for yourself after your procedure. Your health care provider may also give you more specific instructions. If you have problems or questions, contact your health care provider. What can I expect after the procedure? After the procedure, it is common to have:  Bruising and tenderness at the catheter insertion area. Follow these instructions at home: Medicines  Take over-the-counter and prescription medicines only as told by your health care provider. Insertion site care 1. Follow instructions from your health care provider about how to take care of your insertion site. Make sure you: ? Wash your hands with soap and water before you change your bandage (dressing). If soap and water are not available, use hand sanitizer. ? Remove your dressing as told by your health care provider. In 24 hours 2. Check your insertion site every day for signs of infection. Check for: ? Redness, swelling, or pain. ? Fluid or blood. ? Pus or a bad smell. ? Warmth. 3. Do not take baths, swim, or use a hot tub until your health care provider approves. 4. You may shower 24-48 hours after the procedure, or as directed by your health care provider. ? Remove the dressing and gently wash the site with plain soap and water. ? Pat the area dry with a clean towel. ? Do not rub the site. That could cause bleeding. 5. Do not apply powder or lotion to the site. Activity   1. For 24 hours after the procedure, or as directed by your health care provider: ? Do not flex or bend the affected arm. ? Do not push or pull heavy objects with the affected arm. ? Do not drive yourself home from the hospital or clinic. You may drive 24 hours after the procedure unless your health care provider tells you not to. ? Do not operate machinery or power tools. 2. Do not lift  anything that is heavier than 10 lb (4.5 kg), or the limit that you are told, until your health care provider says that it is safe.  For 4 days 3. Ask your health care provider when it is okay to: ? Return to work or school. ? Resume usual physical activities or sports. ? Resume sexual activity. General instructions  If the catheter site starts to bleed, raise your arm and put firm pressure on the site. If the bleeding does not stop, get help right away. This is a medical emergency.  If you went home on the same day as your procedure, a responsible adult should be with you for the first 24 hours after you arrive home.  Keep all follow-up visits as told by your health care provider. This is important. Contact a health care provider if:  You have a fever.  You have redness, swelling, or yellow drainage around your insertion site. Get help right away if:  You have unusual pain at the radial site.  The catheter insertion area swells very fast.  The insertion area is bleeding, and the bleeding does not stop when you hold steady pressure on the area.  Your arm or hand becomes pale, cool, tingly, or numb. These symptoms may represent a serious problem that is an emergency. Do not wait to see if the symptoms will go away. Get medical help right away. Call your local emergency services (911 in the [place] Do  not drive yourself to the hospital. Summary  After the procedure, it is common to have bruising and tenderness at the site.  Follow instructions from your health care provider about how to take care of your radial site wound. Check the wound every day for signs of infection.  Do not lift anything that is heavier than 10 lb (4.5 kg), or the limit that you are told, until your health care provider says that it is safe. This information is not intended to replace advice given to you by your health care provider. Make sure you discuss any questions you have with your health care  provider. Document Revised: 01/24/2017 Document Reviewed: 01/24/2017 Elsevier Patient Education  [place].  Cerebral Angiogram, Care After This sheet gives you information about how to care for yourself after your procedure. Your health care provider may also give you more specific instructions. If you have problems or questions, contact your health care provider. What can I expect after the procedure? After the procedure, it is common to have:  Bruising and tenderness at the catheter insertion site.  A mild headache. Follow these instructions at home: Insertion site care  Follow instructions from your health care provider about how to take care of the insertion site. Make sure you: ? Wash your hands with soap and water before and after you change your bandage (dressing). If soap and water are not available, use hand sanitizer. ? Change your dressing as told by your health care provider.  Do not take baths, swim, or use a hot tub until your health care provider approves. You may shower 24-48 hours after the procedure, or as told by your health care provider.  To clean your insertion site: ? Gently wash the site with plain soap and water. ? Pat the area dry with a clean towel. ? Do not rub the site. This may cause bleeding.  Do not apply powder or lotion to the site. Keep the site clean and dry. Infection signs Check your incision area every day for signs of infection. Check for:  Redness, swelling, or pain.  Fluid or blood.  Warmth.  Pus or a bad smell.  Activity  Do not drive for 24 hours if you were given a sedative during your procedure.  Rest as told by your health care provider.  Do not lift anything that is heavier than 10 lb (4.5 kg), or the limit that you are told, until your health care provider says that it is safe.  Return to your normal activities as told by your health care provider, usually in about a week. Ask your health care provider what  activities are safe for you. General instructions   If your insertion site starts to bleed, lie flat and put pressure on the site. If the bleeding does not stop, get help right away. This is a medical emergency.  Do not use any products that contain nicotine or tobacco, such as cigarettes, e-cigarettes, and chewing tobacco. If you need help quitting, ask your health care provider.  Take over-the-counter and prescription medicines only as told by your health care provider.  Drink enough fluid to keep your urine pale yellow. This helps flush the contrast dye from your body.  Keep all follow-up visits as directed by your health care provider. This is important. Contact a health care provider if:  You have a fever or chills.  You have redness, swelling, or pain around your insertion site.  You have fluid or blood coming from  your insertion site.  The insertion site feels warm to the touch.  You have pus or a bad smell coming from your insertion site.  You notice blood collecting in the tissue around the insertion site (hematoma). The hematoma may be painful to the touch. Get help right away if:  You have chest pain or trouble breathing.  You have severe pain or swelling at the insertion site.  The insertion area bleeds, and bleeding continues after 30 minutes of holding steady pressure on the site.  The arm or leg where the catheter was inserted is pale, cold, numb, tingling, or weak.  You have a rash.  You have any symptoms of a stroke. "BE FAST" is an easy way to remember the main warning signs of a stroke: ? B - Balance. Signs are dizziness, sudden trouble walking, or loss of balance. ? E - Eyes. Signs are trouble seeing or a sudden change in vision. ? F - Face. Signs are sudden weakness or numbness of the face, or the face or eyelid drooping on one side. ? A - Arms. Signs are weakness or numbness in an arm. This happens suddenly and usually on one side of the body. ? S -  Speech. Signs are sudden trouble speaking, slurred speech, or trouble understanding what people say. ? T - Time. Time to call emergency services. Write down what time symptoms started.  You have other signs of a stroke, such as: ? A sudden, severe headache with no known cause. ? Nausea or vomiting. ? Seizure. These symptoms may represent a serious problem that is an emergency. Do not wait to see if the symptoms will go away. Get medical help right away. Call your local emergency services (911 in the [place] Do not drive yourself to the hospital. Summary  Bruising and tenderness at the insertion site are common.  Follow your health care provider's instructions about caring for your insertion site. Change dressing and clean the area as instructed.  If your insertion site bleeds, apply direct pressure until bleeding stops.  Return to your normal activities as told by your health care provider. Ask what activities are safe.  Rest and drink plenty of fluids. This information is not intended to replace advice given to you by your health care provider. Make sure you discuss any questions you have with your health care provider. Document Revised: 07/09/2018 Document Reviewed: 07/09/2018 Elsevier Patient Education  [place].  Moderate Conscious Sedation, Adult, Care After These instructions provide you with information about caring for yourself after your procedure. Your health care provider may also give you more specific instructions. Your treatment has been planned according to current medical practices, but problems sometimes occur. Call your health care provider if you have any problems or questions after your procedure. What can I expect after the procedure? After your procedure, it is common:  To feel sleepy for several hours.  To feel clumsy and have poor balance for several hours.  To have poor judgment for several hours.  To vomit if you eat too soon. Follow these  instructions at home: For at least 24 hours after the procedure:   Do not: ? Participate in activities where you could fall or become injured. ? Drive. ? Use heavy machinery. ? Drink alcohol. ? Take sleeping pills or medicines that cause drowsiness. ? Make important decisions or sign legal documents. ? Take care of children on your own.  Rest. Eating and drinking  Follow the diet recommended by your  health care provider.  If you vomit: ? Drink water, juice, or soup when you can drink without vomiting. ? Make sure you have little or no nausea before eating solid foods. General instructions  Have a responsible adult stay with you until you are awake and alert.  Take over-the-counter and prescription medicines only as told by your health care provider.  If you smoke, do not smoke without supervision.  Keep all follow-up visits as told by your health care provider. This is important. Contact a health care provider if:  You keep feeling nauseous or you keep vomiting.  You feel light-headed.  You develop a rash.  You have a fever. Get help right away if:  You have trouble breathing. This information is not intended to replace advice given to you by your health care provider. Make sure you discuss any questions you have with your health care provider. Document Revised: 12/01/2016 Document Reviewed: 04/10/2015 Elsevier Patient Education  2020 [employer].

## 2019-08-26 ENCOUNTER — Telehealth (HOSPITAL_COMMUNITY): Payer: Self-pay

## 2019-08-26 ENCOUNTER — Other Ambulatory Visit: Payer: Self-pay

## 2019-08-26 ENCOUNTER — Ambulatory Visit (HOSPITAL_COMMUNITY)
Admission: RE | Admit: 2019-08-26 | Discharge: 2019-08-26 | Disposition: A | Discharge status: Home / Self Care | Payer: BC Managed Care – PPO | Source: Ambulatory Visit | Attending: Interventional Radiology | Admitting: Interventional Radiology

## 2019-08-26 DIAGNOSIS — I671 Cerebral aneurysm, nonruptured: Secondary | ICD-10-CM | POA: Diagnosis not present

## 2019-08-26 NOTE — Telephone Encounter (Signed)
Called pt to come in earlier for consult, no answer, left vm. AW

## 2019-08-26 NOTE — Consult Note (Signed)
, The palpitation complications, and ways to mitigate these complications     Chief Complaint: Patient was seen in consultation today to discuss the results of her diagnostic cath arteriogram of 08/19/2019.   Referring Physician(s): Marlie Kuennen   History of Present Illness: Krystal Houston is a 64 y.o. female with a past medical history as below returns today to discuss the findings of her recent follow-up diagnostic cath arteriogram..  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  The recanalized portion measured approximate 5.2 mm x 3 mm, and L and also 4.8 mm x 2.33mm confirmed on 3D rotational arteriogram.  Compared to previous arteriogram of 02/14/2012 worsening recanalization was evident. 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  Given the increased recanalization of the large aneurysm involving the left internal carotid artery paraclinoid region, it was felt that retreatment endovascularly probably without flow TAVR would be optimal to enhance further healing and elimination of aneurysm from the parent circulation.  The procedure the reasons and the risk were discussed with the patient.  Risk of 1 to 2% chance of complication including thromboembolic stroke, and delayed intracranial hemorrhage was reviewed.  Patient will be started will dual antiplatelets aspirin 325 mg AMI Plavix 75 mg a day 7 days prior to procedure.  Following the procedure, the patient would be admitted to the neuro ICU for blood pressure control and treatment with low-dose IV heparin.  The alternative of continued surveillance was mentioned, though not advisable given the angiograph findings.  The patient is agreeable to proceed with endovascular retreatment of worsening recanalization  of the large left internal carotid paraclinoid aneurysm.     Past  Medical History:  Diagnosis Date  . Brain aneurysm   . Hyperlipidemia   . Hypertension    Patient denies  . Leukocytosis    she reports she has had this her whole life, on labs back to 2009    Past Surgical History:  Procedure Laterality Date  . ABDOMINAL HYSTERECTOMY    . CEREBRAL ANEURYSM REPAIR    . IR 3D INDEPENDENT WKST  08/19/2019  . IR ANGIO INTRA EXTRACRAN SEL COM CAROTID INNOMINATE BILAT MOD SED  08/19/2019  . IR ANGIO VERTEBRAL SEL SUBCLAVIAN INNOMINATE UNI R MOD SED  08/19/2019  . IR ANGIO VERTEBRAL SEL VERTEBRAL UNI L MOD SED  08/19/2019  . IR US GUIDE VASC ACCESS RIGHT  08/19/2019  . vaginal deliveries     x 3    Allergies: Sulfamethoxazole  Medications: Prior to Admission medications   Medication Sig Start Date End Date Taking? Authorizing Provider  aspirin EC 81 MG tablet Take 81 mg by mouth daily. Swallow whole.    [provider]  lisinopril (PRINIVIL,ZESTRIL) 10 MG tablet TAKE 1 TABLET (10 MG TOTAL) BY MOUTH DAILY. Patient taking differently: Take 10 mg by mouth daily.  10/19/14   Kennyth Arnold, FNP     Family History  Problem Relation Age of Onset  . Heart disease Mother   . Diabetes Father   . Coronary artery disease Other   . Diabetes Other   . Colon cancer Neg Hx   . Esophageal cancer Neg Hx   . Rectal cancer Neg Hx   . Stomach cancer Neg Hx     Social History   Socioeconomic History  . Marital status: Legally Separated    Spouse name: Not on file  . Number of  children: Not on file  . Years of education: Not on file  . Highest education level: Not on file  Occupational History  . Not on file  Tobacco Use  . Smoking status: Never Smoker  . Smokeless tobacco: Never Used  Vaping Use  . Vaping Use: Never used  Substance and Sexual Activity  . Alcohol use: No  . Drug use: No  . Sexual activity: Not on file  Other Topics Concern  . Not on file  Social History Narrative  . Not on file   Social Determinants of Health    Financial Resource Strain:   . Difficulty of Paying Living Expenses: Not on file  Food Insecurity:   . Worried About Charity fundraiser in the Last Year: Not on file  . Ran Out of Food in the Last Year: Not on file  Transportation Needs:   . Lack of Transportation (Medical): Not on file  . Lack of Transportation (Non-Medical): Not on file  Physical Activity:   . Days of Exercise per Week: Not on file  . Minutes of Exercise per Session: Not on file  Stress:   . Feeling of Stress : Not on file  Social Connections:   . Frequency of Communication with Friends and Family: Not on file  . Frequency of Social Gatherings with Friends and Family: Not on file  . Attends Religious Services: Not on file  . Active Member of Clubs or Organizations: Not on file  . Attends Archivist Meetings: Not on file  . Marital Status: Not on file     Review of Systems: A 12 point ROS discussed and pertinent positives are indicated in the HPI above.  All other systems are negative.  Review of Systems negative unless mentioned above  Vital Signs: There were no vitals taken for this visit.  Physical Exam.  Deferred   Imaging: IR 3D Independent Wkst  Result Date: 08/21/2019 CLINICAL DATA:  Intermittent headaches. Previous history of endovascularly treated intracranial aneurysms. Recent memory difficulties. EXAM: BILATERAL COMMON CAROTID AND INNOMINATE ANGIOGRAPHY COMPARISON:  Previous arteriogram of February 14, 2012 . MEDICATIONS: Heparin 2000 units IV. No antibiotic was administered within 1 hour of the procedure. ANESTHESIA/SEDATION: Versed 1 mg IV; Fentanyl 25 mcg IV Moderate Sedation Time:  15 minutes The patient was continuously monitored during the procedure by the interventional radiology nurse under my direct supervision. CONTRAST:  Isovue 300 approximately 80 cc FLUOROSCOPY TIME:  Fluoroscopy Time: 13 minutes 12 seconds (940 mGy). COMPLICATIONS: None immediate. TECHNIQUE: Informed  written consent was obtained from the patient after a thorough discussion of the procedural risks, benefits and alternatives. All questions were addressed. Maximal Sterile Barrier Technique was utilized including caps, mask, sterile gowns, sterile gloves, sterile drape, hand hygiene and skin antiseptic. A timeout was performed prior to the initiation of the procedure. The right forearm to the wrist was prepped and draped in the usual sterile manner. The right radial artery was identified with ultrasound, and its morphology documented. A dorsal palmar anastomosis was verified to be present. Using ultrasound guidance radial access was obtained using a micropuncture set over a 0.018 inch micro guidewire. A 4/5 French radial sheath was then inserted over the wire without event. The obturator, and micro guidewire were removed. Good aspiration was obtained from the side port of the sheath. A right radial arteriogram was then performed. A cocktail of 2000 units of heparin, 2.5 mg of verapamil, and 200 mcg of nitroglycerin was then infused through the sheath  in diluted form without event. Over a 0.035 inch Roadrunner guidewire, a 5 [place] Simmons 2 diagnostic catheter was advanced to the aortic arch region, and its position just proximal to the origin of the of the right vertebral artery, the right common carotid artery, the left common carotid artery and the left vertebral artery. Following the procedure, hemostasis in the right radial puncture site was achieved with a wrist band. The distal right radial pulse was verified to be present. FINDINGS: The origin of the non dominant right vertebral artery is widely patent. The vessel is seen to opacify to the cranial skull base. Patency of the right posterior-inferior cerebellar artery and the right vertebrobasilar junction is noted. Proximal basilar artery in the right anterior-inferior cerebellar artery/poster-inferior cerebellar artery complex is patent. Unopacified blood  from the contralateral left vertebral artery is seen distal to this. The right common carotid arteriogram demonstrates the right external carotid artery and its major branches to be widely patent. The right internal carotid artery at the bulb to the cranial skull base is widely patent. The petrous, cavernous and supraclinoid segments are widely patent. The right posterior communicating artery is seen opacifying the right posterior cerebral artery distribution. The previously positioned stent in the cavernous segment of the right internal carotid artery is widely patent. 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 The right middle cerebral artery and the right anterior cerebral artery opacify into the capillary and venous phases. Transient cross-filling via the anterior communicating artery of the left anterior cerebral A2 segment is seen. The left common carotid arteriogram demonstrates the left external carotid artery and its major branches to be widely patent. The left internal carotid artery at the bulb to the cranial skull base is widely patent. The petrous, cavernous and supraclinoid segments demonstrate wide patency. 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 Contrast was seen along the inferior medial aspect of the previously treated aneurysm. A 3D rotational arteriogram with reconstruction on separate workstation demonstrates recanalized previously treated aneurysm. This measures approximately 5.2 x 3.2 mm at its distal and arising from the posterior wall, and more proximal recanalized portion measuring approximately 4.8 mm x 2.5 mm. The left middle cerebral artery and the left anterior cerebral artery opacify into the capillary and venous phases. The origin of the dominant left vertebral artery is widely patent. The vessel has mild tortuosity proximally. More  distally the vessel opacifies to the cranial skull base. Patency is maintained of the left posterior-inferior cerebellar artery and the left vertebrobasilar junction. The basilar artery, the posterior cerebral arteries, the superior cerebellar arteries and the anterior-inferior cerebellar arteries opacify into the capillary and venous phases. 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 Measurements are approximately 5.2 mm x 3 mm, and 4.8 mm x 2.5 mm. PLAN: Follow-up consultation in the clinic to discuss management of the above left internal carotid artery angiographic findings. Electronically Signed   By: [name] M.D.   On: 08/19/2019 10:34   IR US Guide Vasc Access Right  Result Date: 08/21/2019 CLINICAL DATA:  Intermittent headaches. Previous history of endovascularly treated intracranial aneurysms. Recent memory difficulties. EXAM: BILATERAL COMMON CAROTID AND INNOMINATE ANGIOGRAPHY COMPARISON:  Previous arteriogram of February 14, 2012 . MEDICATIONS: Heparin 2000 units IV. No antibiotic  was administered within 1 hour of the procedure. ANESTHESIA/SEDATION: Versed 1 mg IV; Fentanyl 25 mcg IV Moderate Sedation Time:  15 minutes The patient was continuously monitored during the procedure by the interventional radiology nurse under my direct supervision. CONTRAST:  Isovue 300 approximately 80 cc FLUOROSCOPY TIME:  Fluoroscopy Time: 13 minutes 12 seconds (940 mGy). COMPLICATIONS: None immediate. TECHNIQUE: Informed written consent was obtained from the patient after a thorough discussion of the procedural risks, benefits and alternatives. All questions were addressed. Maximal Sterile Barrier Technique was utilized including caps, mask, sterile gowns, sterile gloves, sterile drape,  hand hygiene and skin antiseptic. A timeout was performed prior to the initiation of the procedure. The right forearm to the wrist was prepped and draped in the usual sterile manner. The right radial artery was identified with ultrasound, and its morphology documented. A dorsal palmar anastomosis was verified to be present. Using ultrasound guidance radial access was obtained using a micropuncture set over a 0.018 inch micro guidewire. A 4/5 French radial sheath was then inserted over the wire without event. The obturator, and micro guidewire were removed. Good aspiration was obtained from the side port of the sheath. A right radial arteriogram was then performed. A cocktail of 2000 units of heparin, 2.5 mg of verapamil, and 200 mcg of nitroglycerin was then infused through the sheath in diluted form without event. Over a 0.035 inch Roadrunner guidewire, a 5 [place] Simmons 2 diagnostic catheter was advanced to the aortic arch region, and its position just proximal to the origin of the of the right vertebral artery, the right common carotid artery, the left common carotid artery and the left vertebral artery. Following the procedure, hemostasis in the right radial puncture site was achieved with a wrist band. The distal right radial pulse was verified to be present. FINDINGS: The origin of the non dominant right vertebral artery is widely patent. The vessel is seen to opacify to the cranial skull base. Patency of the right posterior-inferior cerebellar artery and the right vertebrobasilar junction is noted. Proximal basilar artery in the right anterior-inferior cerebellar artery/poster-inferior cerebellar artery complex is patent. Unopacified blood from the contralateral left vertebral artery is seen distal to this. The right common carotid arteriogram demonstrates the right external carotid artery and its major branches to be widely patent. The right internal carotid artery at the bulb to the cranial skull base is  widely patent. The petrous, cavernous and supraclinoid segments are widely patent. The right posterior communicating artery is seen opacifying the right posterior cerebral artery distribution. The previously positioned stent in the cavernous segment of the right internal carotid artery is widely patent. 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 The right middle cerebral artery and the right anterior cerebral artery opacify into the capillary and venous phases. Transient cross-filling via the anterior communicating artery of the left anterior cerebral A2 segment is seen. The left common carotid arteriogram demonstrates the left external carotid artery and its major branches to be widely patent. The left internal carotid artery at the bulb to the cranial skull base is widely patent. The petrous, cavernous and supraclinoid segments demonstrate wide patency. 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 Contrast was seen along the inferior medial aspect of the previously treated aneurysm. A 3D rotational arteriogram with reconstruction on separate workstation demonstrates recanalized previously treated aneurysm. This measures approximately 5.2 x 3.2 mm at its distal and  arising from the posterior wall, and more proximal recanalized portion measuring approximately 4.8 mm x 2.5 mm. The left middle cerebral artery and the left anterior cerebral artery opacify into the capillary and venous phases. The origin of the dominant left vertebral artery is widely patent. The vessel has mild tortuosity proximally. More distally the vessel opacifies to the cranial skull base. Patency is maintained of the left posterior-inferior cerebellar artery and the left vertebrobasilar junction. The basilar artery, the posterior cerebral arteries, the superior cerebellar arteries and the  anterior-inferior cerebellar arteries opacify into the capillary and venous phases. 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 Measurements are approximately 5.2 mm x 3 mm, and 4.8 mm x 2.5 mm. PLAN: Follow-up consultation in the clinic to discuss management of the above left internal carotid artery angiographic findings. Electronically Signed   By: [name] M.D.   On: 08/19/2019 10:34   IR ANGIO INTRA EXTRACRAN SEL COM CAROTID INNOMINATE BILAT MOD SED  Result Date: 08/21/2019 CLINICAL DATA:  Intermittent headaches. Previous history of endovascularly treated intracranial aneurysms. Recent memory difficulties. EXAM: BILATERAL COMMON CAROTID AND INNOMINATE ANGIOGRAPHY COMPARISON:  Previous arteriogram of February 14, 2012 . MEDICATIONS: Heparin 2000 units IV. No antibiotic was administered within 1 hour of the procedure. ANESTHESIA/SEDATION: Versed 1 mg IV; Fentanyl 25 mcg IV Moderate Sedation Time:  15 minutes The patient was continuously monitored during the procedure by the interventional radiology nurse under my direct supervision. CONTRAST:  Isovue 300 approximately 80 cc FLUOROSCOPY TIME:  Fluoroscopy Time: 13 minutes 12 seconds (940 mGy). COMPLICATIONS: None immediate. TECHNIQUE: Informed written consent was obtained from the patient after a thorough discussion of the procedural risks, benefits and alternatives. All questions were addressed. Maximal Sterile Barrier Technique was utilized including caps, mask, sterile gowns, sterile gloves, sterile drape, hand hygiene and skin antiseptic. A timeout was performed prior to the initiation of the procedure. The right forearm to the wrist was prepped and draped in the usual sterile manner. The right radial artery was identified with  ultrasound, and its morphology documented. A dorsal palmar anastomosis was verified to be present. Using ultrasound guidance radial access was obtained using a micropuncture set over a 0.018 inch micro guidewire. A 4/5 French radial sheath was then inserted over the wire without event. The obturator, and micro guidewire were removed. Good aspiration was obtained from the side port of the sheath. A right radial arteriogram was then performed. A cocktail of 2000 units of heparin, 2.5 mg of verapamil, and 200 mcg of nitroglycerin was then infused through the sheath in diluted form without event. Over a 0.035 inch Roadrunner guidewire, a 5 [place] Simmons 2 diagnostic catheter was advanced to the aortic arch region, and its position just proximal to the origin of the of the right vertebral artery, the right common carotid artery, the left common carotid artery and the left vertebral artery. Following the procedure, hemostasis in the right radial puncture site was achieved with a wrist band. The distal right radial pulse was verified to be present. FINDINGS: The origin of the non dominant right vertebral artery is widely patent. The vessel is seen to opacify to the cranial skull base. Patency of the right posterior-inferior cerebellar artery and the right vertebrobasilar junction is noted. Proximal basilar artery in the right anterior-inferior cerebellar artery/poster-inferior cerebellar artery complex is patent. Unopacified blood  from the contralateral left vertebral artery is seen distal to this. The right common carotid arteriogram demonstrates the right external carotid artery and its major branches to be widely patent. The right internal carotid artery at the bulb to the cranial skull base is widely patent. The petrous, cavernous and supraclinoid segments are widely patent. The right posterior communicating artery is seen opacifying the right posterior cerebral artery distribution. The previously positioned stent in  the cavernous segment of the right internal carotid artery is widely patent. 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 The right middle cerebral artery and the right anterior cerebral artery opacify into the capillary and venous phases. Transient cross-filling via the anterior communicating artery of the left anterior cerebral A2 segment is seen. The left common carotid arteriogram demonstrates the left external carotid artery and its major branches to be widely patent. The left internal carotid artery at the bulb to the cranial skull base is widely patent. The petrous, cavernous and supraclinoid segments demonstrate wide patency. 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 Contrast was seen along the inferior medial aspect of the previously treated aneurysm. A 3D rotational arteriogram with reconstruction on separate workstation demonstrates recanalized previously treated aneurysm. This measures approximately 5.2 x 3.2 mm at its distal and arising from the posterior wall, and more proximal recanalized portion measuring approximately 4.8 mm x 2.5 mm. The left middle cerebral artery and the left anterior cerebral artery opacify into the capillary and venous phases. The origin of the dominant left vertebral artery is widely patent. The vessel has mild tortuosity proximally. More distally the vessel opacifies to the cranial skull base. Patency is maintained of the left posterior-inferior cerebellar artery and the left vertebrobasilar junction. The basilar artery, the posterior cerebral arteries, the superior cerebellar arteries and the anterior-inferior cerebellar arteries opacify into the capillary and venous phases. 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 Partial recanalization  of the previously coiled and stented large left internal carotid paraclinoid aneurysm long its medial aspect. Measurements are approximately 5.2 mm x 3 mm, and 4.8 mm x 2.5 mm. PLAN: Follow-up consultation in the clinic to discuss management of the above left internal carotid artery angiographic findings. Electronically Signed   By: [name] M.D.   On: 08/19/2019 10:34   IR ANGIO VERTEBRAL SEL SUBCLAVIAN INNOMINATE UNI R MOD SED  Result Date: 08/21/2019 CLINICAL DATA:  Intermittent headaches. Previous history of endovascularly treated intracranial aneurysms. Recent memory difficulties. EXAM: BILATERAL COMMON CAROTID AND INNOMINATE ANGIOGRAPHY COMPARISON:  Previous arteriogram of February 14, 2012 . MEDICATIONS: Heparin 2000 units IV. No antibiotic was administered within 1 hour of the procedure. ANESTHESIA/SEDATION: Versed 1 mg IV; Fentanyl 25 mcg IV Moderate Sedation Time:  15 minutes The patient was continuously monitored during the procedure by the interventional radiology nurse under my direct supervision. CONTRAST:  Isovue 300 approximately 80 cc FLUOROSCOPY TIME:  Fluoroscopy Time: 13 minutes 12 seconds (940 mGy). COMPLICATIONS: None immediate. TECHNIQUE: Informed written consent was obtained from the patient after a thorough discussion of the procedural risks, benefits and alternatives. All questions were addressed. Maximal Sterile Barrier Technique was utilized including caps, mask, sterile gowns, sterile gloves, sterile drape, hand hygiene and skin antiseptic. A timeout was performed prior to the initiation of the procedure. The right forearm to the wrist was prepped and draped in the usual sterile manner.  The right radial artery was identified with ultrasound, and its morphology documented. A dorsal palmar anastomosis was verified to be present. Using ultrasound guidance radial access was obtained using a micropuncture set over a 0.018 inch micro guidewire. A 4/5 French radial sheath was then  inserted over the wire without event. The obturator, and micro guidewire were removed. Good aspiration was obtained from the side port of the sheath. A right radial arteriogram was then performed. A cocktail of 2000 units of heparin, 2.5 mg of verapamil, and 200 mcg of nitroglycerin was then infused through the sheath in diluted form without event. Over a 0.035 inch Roadrunner guidewire, a 5 [place] Simmons 2 diagnostic catheter was advanced to the aortic arch region, and its position just proximal to the origin of the of the right vertebral artery, the right common carotid artery, the left common carotid artery and the left vertebral artery. Following the procedure, hemostasis in the right radial puncture site was achieved with a wrist band. The distal right radial pulse was verified to be present. FINDINGS: The origin of the non dominant right vertebral artery is widely patent. The vessel is seen to opacify to the cranial skull base. Patency of the right posterior-inferior cerebellar artery and the right vertebrobasilar junction is noted. Proximal basilar artery in the right anterior-inferior cerebellar artery/poster-inferior cerebellar artery complex is patent. Unopacified blood from the contralateral left vertebral artery is seen distal to this. The right common carotid arteriogram demonstrates the right external carotid artery and its major branches to be widely patent. The right internal carotid artery at the bulb to the cranial skull base is widely patent. The petrous, cavernous and supraclinoid segments are widely patent. The right posterior communicating artery is seen opacifying the right posterior cerebral artery distribution. The previously positioned stent in the cavernous segment of the right internal carotid artery is widely patent. 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 The right middle cerebral artery and the right anterior cerebral artery  opacify into the capillary and venous phases. Transient cross-filling via the anterior communicating artery of the left anterior cerebral A2 segment is seen. The left common carotid arteriogram demonstrates the left external carotid artery and its major branches to be widely patent. The left internal carotid artery at the bulb to the cranial skull base is widely patent. The petrous, cavernous and supraclinoid segments demonstrate wide patency. 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 Contrast was seen along the inferior medial aspect of the previously treated aneurysm. A 3D rotational arteriogram with reconstruction on separate workstation demonstrates recanalized previously treated aneurysm. This measures approximately 5.2 x 3.2 mm at its distal and arising from the posterior wall, and more proximal recanalized portion measuring approximately 4.8 mm x 2.5 mm. The left middle cerebral artery and the left anterior cerebral artery opacify into the capillary and venous phases. The origin of the dominant left vertebral artery is widely patent. The vessel has mild tortuosity proximally. More distally the vessel opacifies to the cranial skull base. Patency is maintained of the left posterior-inferior cerebellar artery and the left vertebrobasilar junction. The basilar artery, the posterior cerebral arteries, the superior cerebellar arteries and the anterior-inferior cerebellar arteries opacify into the capillary and venous phases. 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 Measurements are approximately 5.2 mm x 3 mm, and 4.8 mm x 2.5 mm. PLAN: Follow-up consultation in the clinic to discuss management of  the above left internal carotid artery angiographic findings. Electronically Signed   By: [name] M.D.   On: 08/19/2019 10:34   IR ANGIO VERTEBRAL SEL VERTEBRAL UNI L MOD SED  Result Date: 08/21/2019 CLINICAL DATA:  Intermittent headaches. Previous history of endovascularly treated intracranial aneurysms. Recent memory difficulties. EXAM: BILATERAL COMMON CAROTID AND INNOMINATE ANGIOGRAPHY COMPARISON:  Previous arteriogram of February 14, 2012 . MEDICATIONS: Heparin 2000 units IV. No antibiotic was administered within 1 hour of the procedure. ANESTHESIA/SEDATION: Versed 1 mg IV; Fentanyl 25 mcg IV Moderate Sedation Time:  15 minutes The patient was continuously monitored during the procedure by the interventional radiology nurse under my direct supervision. CONTRAST:  Isovue 300 approximately 80 cc FLUOROSCOPY TIME:  Fluoroscopy Time: 13 minutes 12 seconds (940 mGy). COMPLICATIONS: None immediate. TECHNIQUE: Informed written consent was obtained from the patient after a thorough discussion of the procedural risks, benefits and alternatives. All questions were addressed. Maximal Sterile Barrier Technique was utilized including caps, mask, sterile gowns, sterile gloves, sterile drape, hand hygiene and skin antiseptic. A timeout was performed prior to the initiation of the procedure. The right forearm to the wrist was prepped and draped in the usual sterile manner. The right radial artery was identified with ultrasound, and its morphology documented. A dorsal palmar anastomosis was verified to be present. Using ultrasound guidance radial access was obtained using a micropuncture set over a 0.018 inch micro guidewire. A 4/5 French radial sheath was then inserted over the wire without event. The obturator, and micro guidewire were removed. Good aspiration was obtained from the side port of the sheath. A right radial arteriogram was then performed. A cocktail of 2000 units of heparin, 2.5 mg of verapamil,  and 200 mcg of nitroglycerin was then infused through the sheath in diluted form without event. Over a 0.035 inch Roadrunner guidewire, a 5 [place] Simmons 2 diagnostic catheter was advanced to the aortic arch region, and its position just proximal to the origin of the of the right vertebral artery, the right common carotid artery, the left common carotid artery and the left vertebral artery. Following the procedure, hemostasis in the right radial puncture site was achieved with a wrist band. The distal right radial pulse was verified to be present. FINDINGS: The origin of the non dominant right vertebral artery is widely patent. The vessel is seen to opacify to the cranial skull base. Patency of the right posterior-inferior cerebellar artery and the right vertebrobasilar junction is noted. Proximal basilar artery in the right anterior-inferior cerebellar artery/poster-inferior cerebellar artery complex is patent. Unopacified blood from the contralateral left vertebral artery is seen distal to this. The right common carotid arteriogram demonstrates the right external carotid artery and its major branches to be widely patent. The right internal carotid artery at the bulb to the cranial skull base is widely patent. The petrous, cavernous and supraclinoid segments are widely patent. The right posterior communicating artery is seen opacifying the right posterior cerebral artery distribution. The previously positioned stent in the cavernous segment of the right internal carotid artery is widely patent. 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 The right middle cerebral artery and the right anterior cerebral artery opacify into the capillary and venous phases. Transient cross-filling via the anterior communicating artery of the left anterior cerebral A2 segment is seen. The  left common carotid arteriogram demonstrates the left external carotid artery and its major  branches to be widely patent. The left internal carotid artery at the bulb to the cranial skull base is widely patent. The petrous, cavernous and supraclinoid segments demonstrate wide patency. 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 Contrast was seen along the inferior medial aspect of the previously treated aneurysm. A 3D rotational arteriogram with reconstruction on separate workstation demonstrates recanalized previously treated aneurysm. This measures approximately 5.2 x 3.2 mm at its distal and arising from the posterior wall, and more proximal recanalized portion measuring approximately 4.8 mm x 2.5 mm. The left middle cerebral artery and the left anterior cerebral artery opacify into the capillary and venous phases. The origin of the dominant left vertebral artery is widely patent. The vessel has mild tortuosity proximally. More distally the vessel opacifies to the cranial skull base. Patency is maintained of the left posterior-inferior cerebellar artery and the left vertebrobasilar junction. The basilar artery, the posterior cerebral arteries, the superior cerebellar arteries and the anterior-inferior cerebellar arteries opacify into the capillary and venous phases. 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 Measurements are approximately 5.2 mm x 3 mm, and 4.8 mm x 2.5 mm. PLAN: Follow-up consultation in the clinic to discuss management of the above left internal carotid artery angiographic findings. Electronically Signed   By: [name] M.D.   On: 08/19/2019 10:34    Labs:  CBC: Recent Labs    08/19/19 0640  WBC 3.3*  HGB 12.6  HCT 40.2  PLT 286    COAGS: Recent  Labs    08/19/19 0640  INR 0.9    BMP: Recent Labs    08/19/19 0640  NA 142  K 3.6  CL 108  CO2 25  GLUCOSE 105*  BUN 6*  CALCIUM 9.1  CREATININE 0.76  GFRNONAA >60  GFRAA >60    LIVER FUNCTION TESTS: No results for input(s): BILITOT, AST, ALT, ALKPHOS, PROT, ALBUMIN in the last 8760 hours.  TUMOR MARKERS: No results for input(s): AFPTM, CEA, CA199, CHROMGRNA in the last 8760 hours.  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  Informed patient that our schedulers will call to schedule the treatment with general anesthesia.  Patient was started on aspirin 325 mg a day, and Plavix 75 mg a day 7 days prior to procedure.  Check P2 Y 12 5 days after starting the dual antiplatelets  All questions answered and concerns addressed. Patient conveys understanding and agrees with plan.  Thank you for this interesting consult.  I greatly enjoyed meeting [employer] and look forward to participating in their care.  A copy of this report was sent to the requesting provider on this date.  Electronically Signed: Rob Hickman, MD 08/26/2019, 1:06 PM   I spent a total of 20 minutes in face to face in clinical consultation, greater than 50% of which was counseling/coordinating care for retreatment of the recanalized aneurysm i including intraprocedural and postprocedural management. Also reviewing electronic medical chart for previous imaging studies, and notes.

## 2019-08-28 ENCOUNTER — Encounter (HOSPITAL_COMMUNITY): Payer: Self-pay | Admitting: Radiology

## 2019-08-28 ENCOUNTER — Other Ambulatory Visit (HOSPITAL_COMMUNITY): Payer: Self-pay | Admitting: Interventional Radiology

## 2019-08-28 DIAGNOSIS — I671 Cerebral aneurysm, nonruptured: Secondary | ICD-10-CM

## 2019-09-02 ENCOUNTER — Telehealth (HOSPITAL_COMMUNITY): Payer: Self-pay

## 2019-09-02 NOTE — Telephone Encounter (Signed)
Pt called to schedule her treatment. Informed pt that elective procedures that require an overnight stay have been postponed until the beginning of October. Pt was concerned about waiting this long. I spoke with Dr. Estanislado Pandy and unless she is having symptoms it is ok to wait until restrictions are lifted. Pt agreed with this plan. AW

## 2019-09-03 NOTE — Interval H&P Note (Signed)
History and Physical Interval Note:  09/03/2019 12:58 PM  Krystal Houston  has presented today for surgery, with the diagnosis of brain aneurysm.  The various methods of treatment have been discussed with the patient and family. After consideration of risks, benefits and other options for treatment, the patient has consented to an image-guided diagnostic cerebral arteriogram.  The patient's history has been reviewed, patient examined, no change in status, stable for surgery.  I have reviewed the patient's chart and labs.  Questions were answered to the patient's satisfaction.     Earley Abide, PA-C

## 2019-09-25 ENCOUNTER — Ambulatory Visit
Admission: EM | Admit: 2019-09-25 | Discharge: 2019-09-25 | Disposition: A | Discharge status: Home / Self Care | Payer: BC Managed Care – PPO | Attending: Emergency Medicine | Admitting: Emergency Medicine

## 2019-09-25 DIAGNOSIS — R300 Dysuria: Secondary | ICD-10-CM | POA: Insufficient documentation

## 2019-09-25 LAB — POCT URINALYSIS DIP (MANUAL ENTRY)
Bilirubin, UA: NEGATIVE
Glucose, UA: NEGATIVE mg/dL
Ketones, POC UA: NEGATIVE mg/dL
Leukocytes, UA: NEGATIVE
Nitrite, UA: NEGATIVE
Protein Ur, POC: NEGATIVE mg/dL
Spec Grav, UA: 1.02 (ref 1.010–1.025)
Urobilinogen, UA: 0.2 E.U./dL
pH, UA: 6 (ref 5.0–8.0)

## 2019-09-25 MED ORDER — NITROFURANTOIN MONOHYD MACRO 100 MG PO CAPS
100.0000 mg | ORAL_CAPSULE | Freq: Two times a day (BID) | ORAL | 0 refills | Status: DC
Start: 2019-09-25 — End: 2019-10-24

## 2019-09-25 NOTE — ED Provider Notes (Signed)
[facility]   CC: Burning with urination  SUBJECTIVE:  Krystal Houston is a 64 y.o. female who complains of dysuria x 1 week.  Admits to sexual activity prior to symptoms.  Complains of mild discomfort to RT flank and lower abdominal pressure.  Has tried OTC medications without relief.  Symptoms are made worse with urination.  Admits to similar symptoms in the past with UTI.  Denies fever, chills, nausea, vomiting, abnormal vaginal discharge or bleeding, hematuria.    LMP: No LMP recorded. Patient has had a hysterectomy.  ROS: As in HPI.  All other pertinent ROS negative.     Past Medical History:  Diagnosis Date  . Brain aneurysm   . Hyperlipidemia   . Hypertension    Patient denies  . Leukocytosis    she reports she has had this her whole life, on labs back to 2009   Past Surgical History:  Procedure Laterality Date  . ABDOMINAL HYSTERECTOMY    . CEREBRAL ANEURYSM REPAIR    . IR 3D INDEPENDENT WKST  08/19/2019  . IR ANGIO INTRA EXTRACRAN SEL COM CAROTID INNOMINATE BILAT MOD SED  08/19/2019  . IR ANGIO VERTEBRAL SEL VERTEBRAL BILAT MOD SED  08/19/2019  . IR US GUIDE VASC ACCESS RIGHT  08/19/2019  . vaginal deliveries     x 3   Allergies  Allergen Reactions  . Sulfamethoxazole Hives, Rash and Other (See Comments)    REACTION: vomiting   No current facility-administered medications on file prior to encounter.   Current Outpatient Medications on File Prior to Encounter  Medication Sig Dispense Refill  . aspirin EC 81 MG tablet Take 81 mg by mouth daily. Swallow whole.     Social History   Socioeconomic History  . Marital status: Legally Separated    Spouse name: Not on file  . Number of children: Not on file  . Years of education: Not on file  . Highest education level: Not on file  Occupational History  . Not on file  Tobacco Use  . Smoking status: Never Smoker  . Smokeless tobacco: Never Used  Vaping Use  . Vaping Use: Never used  Substance  and Sexual Activity  . Alcohol use: No  . Drug use: No  . Sexual activity: Not on file  Other Topics Concern  . Not on file  Social History Narrative  . Not on file   Social Determinants of Health   Financial Resource Strain:   . Difficulty of Paying Living Expenses: Not on file  Food Insecurity:   . Worried About Charity fundraiser in the Last Year: Not on file  . Ran Out of Food in the Last Year: Not on file  Transportation Needs:   . Lack of Transportation (Medical): Not on file  . Lack of Transportation (Non-Medical): Not on file  Physical Activity:   . Days of Exercise per Week: Not on file  . Minutes of Exercise per Session: Not on file  Stress:   . Feeling of Stress : Not on file  Social Connections:   . Frequency of Communication with Friends and Family: Not on file  . Frequency of Social Gatherings with Friends and Family: Not on file  . Attends Religious Services: Not on file  . Active Member of Clubs or Organizations: Not on file  . Attends Archivist Meetings: Not on file  . Marital Status: Not on file  Intimate Partner Violence:   . Fear of Current or  Ex-Partner: Not on file  . Emotionally Abused: Not on file  . Physically Abused: Not on file  . Sexually Abused: Not on file   Family History  Problem Relation Age of Onset  . Heart disease Mother   . Diabetes Father   . Coronary artery disease Other   . Diabetes Other   . Colon cancer Neg Hx   . Esophageal cancer Neg Hx   . Rectal cancer Neg Hx   . Stomach cancer Neg Hx     OBJECTIVE:  Vitals:   09/25/19 1330  BP: (!) 149/90  Pulse: 69  Resp: 20  Temp: 98.6 F (37 C)  SpO2: 95%   General appearance: Alert in no acute distress HEENT: NCAT.  Oropharynx clear.  Lungs: clear to auscultation bilaterally without adventitious breath sounds Heart: regular rate and rhythm.   Abdomen: soft; non-distended; no tenderness; bowel sounds present; no guarding Extremities: no edema; symmetrical  with no gross deformities Skin: warm and dry Neurologic: Ambulates from chair to exam table without difficulty Psychological: alert and cooperative; normal mood and affect  Labs Reviewed  POCT URINALYSIS DIP (MANUAL ENTRY) - Abnormal; Notable for the following components:      Result Value   Blood, UA trace-intact (*)    All other components within normal limits  URINE CULTURE   ASSESSMENT & PLAN:  1. Dysuria    Meds ordered this encounter  Medications  . nitrofurantoin, macrocrystal-monohydrate, (MACROBID) 100 MG capsule    Sig: Take 1 capsule (100 mg total) by mouth 2 (two) times daily.    Dispense:  10 capsule    Refill:  0    Order Specific Question:   Supervising Provider    Answer:   Raylene Everts [[id number]]   Urine concerning for UTI Urine culture sent.  We will call you with the results.   Push fluids and get plenty of rest.   Take antibiotic as directed and to completion Follow up with PCP if symptoms persists Return here or go to ER if you have any new or worsening symptoms such as fever, worsening abdominal pain, nausea/vomiting, flank pain, etc...  Outlined signs and symptoms indicating need for more acute intervention. Patient verbalized understanding. After Visit Summary given.     Lestine Box, PA-C 09/25/19 1348

## 2019-09-25 NOTE — Discharge Instructions (Signed)
Urine concerning for UTI Urine culture sent.  We will call you with the results.   Push fluids and get plenty of rest.   Take antibiotic as directed and to completion Follow up with PCP if symptoms persists Return here or go to ER if you have any new or worsening symptoms such as fever, worsening abdominal pain, nausea/vomiting, flank pain, etc... 

## 2019-09-25 NOTE — ED Triage Notes (Signed)
Pt presents with c/o dysuria that began about a week ago

## 2019-09-27 LAB — URINE CULTURE: Culture: <10000 — AB

## 2019-09-29 ENCOUNTER — Telehealth (HOSPITAL_COMMUNITY): Payer: Self-pay

## 2019-09-29 NOTE — Telephone Encounter (Signed)
Returned pt's call. Informed her that elective procedures were still on hold. Krystal Houston has her paperwork and will call her to schedule as soon as things open back up. Pt agreed with this plan. AW

## 2019-10-13 ENCOUNTER — Other Ambulatory Visit (HOSPITAL_COMMUNITY): Payer: Self-pay | Admitting: Interventional Radiology

## 2019-10-13 ENCOUNTER — Telehealth (HOSPITAL_COMMUNITY): Payer: Self-pay | Admitting: Radiology

## 2019-10-13 DIAGNOSIS — I671 Cerebral aneurysm, nonruptured: Secondary | ICD-10-CM

## 2019-10-13 NOTE — Telephone Encounter (Signed)
Called pt to schedule LICA aneurysm embolization with Deveshwar. No answer, left VM. JM

## 2019-10-17 ENCOUNTER — Telehealth (HOSPITAL_COMMUNITY): Payer: Self-pay | Admitting: Radiology

## 2019-10-17 ENCOUNTER — Other Ambulatory Visit: Payer: Self-pay | Admitting: Student

## 2019-10-17 ENCOUNTER — Other Ambulatory Visit (HOSPITAL_COMMUNITY)
Admission: RE | Admit: 2019-10-17 | Discharge: 2019-10-17 | Disposition: A | Discharge status: Home / Self Care | Payer: BC Managed Care – PPO | Source: Ambulatory Visit | Attending: Interventional Radiology | Admitting: Interventional Radiology

## 2019-10-17 DIAGNOSIS — I671 Cerebral aneurysm, nonruptured: Secondary | ICD-10-CM

## 2019-10-17 LAB — PLATELET INHIBITION P2Y12: Platelet Function  P2Y12: 240 [PRU] (ref 182–335)

## 2019-10-17 NOTE — Telephone Encounter (Signed)
Called pt, left VM that per Deveshwar her P2Y12 results today are too high. He is instructing her to take 2 Plavix 75mg  on 10/17/19 and 10/18/19 and then go back to 1 daily on 10/17. Return to [facility] radiology on 10/18 for another P2Y12 recheck. Told patient to return my call for any clarification and verification if needed. JM

## 2019-10-20 ENCOUNTER — Other Ambulatory Visit (HOSPITAL_COMMUNITY): Payer: BC Managed Care – PPO

## 2019-10-20 ENCOUNTER — Other Ambulatory Visit (HOSPITAL_COMMUNITY)
Admission: RE | Admit: 2019-10-20 | Discharge: 2019-10-20 | Disposition: A | Discharge status: Home / Self Care | Payer: BC Managed Care – PPO | Source: Ambulatory Visit

## 2019-10-20 ENCOUNTER — Other Ambulatory Visit (HOSPITAL_COMMUNITY): Payer: Self-pay | Admitting: Radiology

## 2019-10-20 ENCOUNTER — Other Ambulatory Visit (HOSPITAL_COMMUNITY)
Admission: RE | Admit: 2019-10-20 | Discharge: 2019-10-20 | Disposition: A | Discharge status: Home / Self Care | Payer: BC Managed Care – PPO | Source: Ambulatory Visit | Attending: Interventional Radiology | Admitting: Interventional Radiology

## 2019-10-20 DIAGNOSIS — Z20822 Contact with and (suspected) exposure to covid-19: Secondary | ICD-10-CM | POA: Insufficient documentation

## 2019-10-20 DIAGNOSIS — Z01818 Encounter for other preprocedural examination: Secondary | ICD-10-CM | POA: Insufficient documentation

## 2019-10-20 DIAGNOSIS — I671 Cerebral aneurysm, nonruptured: Secondary | ICD-10-CM

## 2019-10-20 LAB — SARS CORONAVIRUS 2 (TAT 6-24 HRS): SARS Coronavirus 2: NEGATIVE

## 2019-10-20 LAB — PLATELET INHIBITION P2Y12: Platelet Function  P2Y12: 124 [PRU] — ABNORMAL LOW (ref 182–335)

## 2019-10-22 ENCOUNTER — Encounter (HOSPITAL_COMMUNITY): Payer: Self-pay | Admitting: Interventional Radiology

## 2019-10-22 ENCOUNTER — Other Ambulatory Visit: Payer: Self-pay

## 2019-10-22 ENCOUNTER — Other Ambulatory Visit: Payer: Self-pay | Admitting: Radiology

## 2019-10-22 NOTE — Progress Notes (Signed)
Patient denies shortness of breath, fever, cough or chest pain.  PCP -  None - Urgent Care when needed Cardiologist - n/a  Chest x-ray - n/a EKG - n/a Stress Test - n/a ECHO - n/a Cardiac Cath - n/a  Anesthesia review: Yes  STOP now taking any Aspirin (unless otherwise instructed by your surgeon), Aleve, Naproxen, Ibuprofen, Motrin, Advil, Goody's, BC's, all herbal medications, fish oil, and all vitamins.   Coronavirus Screening Covid test on 10/20/19 was negative.    Patient verbalized understanding of instructions that were given via phone.

## 2019-10-23 ENCOUNTER — Encounter (HOSPITAL_COMMUNITY)
Admission: AD | Disposition: A | Discharge status: Home / Self Care | Payer: Self-pay | Source: Home / Self Care | Attending: Interventional Radiology

## 2019-10-23 ENCOUNTER — Observation Stay (HOSPITAL_COMMUNITY)
Admission: AD | Admit: 2019-10-23 | Discharge: 2019-10-24 | Disposition: A | Discharge status: Home / Self Care | Payer: BC Managed Care – PPO | Attending: Interventional Radiology | Admitting: Interventional Radiology

## 2019-10-23 ENCOUNTER — Ambulatory Visit (HOSPITAL_COMMUNITY): Payer: BC Managed Care – PPO | Admitting: Physician Assistant

## 2019-10-23 ENCOUNTER — Encounter (HOSPITAL_COMMUNITY): Payer: Self-pay | Admitting: Interventional Radiology

## 2019-10-23 ENCOUNTER — Observation Stay (HOSPITAL_COMMUNITY)
Admission: RE | Admit: 2019-10-23 | Discharge: 2019-10-23 | Disposition: A | Discharge status: Home / Self Care | Payer: BC Managed Care – PPO | Source: Ambulatory Visit | Attending: Interventional Radiology | Admitting: Interventional Radiology

## 2019-10-23 ENCOUNTER — Other Ambulatory Visit: Payer: Self-pay

## 2019-10-23 DIAGNOSIS — Z7902 Long term (current) use of antithrombotics/antiplatelets: Secondary | ICD-10-CM | POA: Insufficient documentation

## 2019-10-23 DIAGNOSIS — I10 Essential (primary) hypertension: Secondary | ICD-10-CM | POA: Diagnosis not present

## 2019-10-23 DIAGNOSIS — Z23 Encounter for immunization: Secondary | ICD-10-CM | POA: Diagnosis not present

## 2019-10-23 DIAGNOSIS — Z8249 Family history of ischemic heart disease and other diseases of the circulatory system: Secondary | ICD-10-CM | POA: Diagnosis not present

## 2019-10-23 DIAGNOSIS — I671 Cerebral aneurysm, nonruptured: Secondary | ICD-10-CM

## 2019-10-23 DIAGNOSIS — Z79899 Other long term (current) drug therapy: Secondary | ICD-10-CM | POA: Insufficient documentation

## 2019-10-23 DIAGNOSIS — Z882 Allergy status to sulfonamides status: Secondary | ICD-10-CM | POA: Insufficient documentation

## 2019-10-23 DIAGNOSIS — Z7982 Long term (current) use of aspirin: Secondary | ICD-10-CM | POA: Diagnosis not present

## 2019-10-23 DIAGNOSIS — E785 Hyperlipidemia, unspecified: Secondary | ICD-10-CM | POA: Diagnosis not present

## 2019-10-23 HISTORY — PX: IR US GUIDE VASC ACCESS RIGHT: IMG2390

## 2019-10-23 HISTORY — PX: IR CT HEAD LTD: IMG2386

## 2019-10-23 HISTORY — PX: IR ANGIO INTRA EXTRACRAN SEL INTERNAL CAROTID UNI L MOD SED: IMG5361

## 2019-10-23 HISTORY — PX: IR TRANSCATH/EMBOLIZ: IMG695

## 2019-10-23 HISTORY — PX: RADIOLOGY WITH ANESTHESIA: SHX6223

## 2019-10-23 HISTORY — DX: Headache, unspecified: R51.9

## 2019-10-23 HISTORY — PX: IR ANGIOGRAM FOLLOW UP STUDY: IMG697

## 2019-10-23 LAB — URINALYSIS, COMPLETE (UACMP) WITH MICROSCOPIC
Bacteria, UA: NONE SEEN
Bilirubin Urine: NEGATIVE
Glucose, UA: NEGATIVE mg/dL
Ketones, ur: NEGATIVE mg/dL
Leukocytes,Ua: NEGATIVE
Nitrite: NEGATIVE
Protein, ur: NEGATIVE mg/dL
Specific Gravity, Urine: 1.023 (ref 1.005–1.030)
pH: 5 (ref 5.0–8.0)

## 2019-10-23 LAB — BASIC METABOLIC PANEL
Anion gap: 9 (ref 5–15)
BUN: 8 mg/dL (ref 8–23)
CO2: 24 mmol/L (ref 22–32)
Calcium: 9 mg/dL (ref 8.9–10.3)
Chloride: 110 mmol/L (ref 98–111)
Creatinine, Ser: 0.76 mg/dL (ref 0.44–1.00)
GFR, Estimated: >60 mL/min (ref >60)
Glucose, Bld: 107 mg/dL — ABNORMAL HIGH (ref 70–99)
Potassium: 3.7 mmol/L (ref 3.5–5.1)
Sodium: 143 mmol/L (ref 135–145)

## 2019-10-23 LAB — CBC WITH DIFFERENTIAL/PLATELET
Abs Immature Granulocytes: 0.01 10*3/uL (ref 0.00–0.07)
Basophils Absolute: 0 10*3/uL (ref 0.0–0.1)
Basophils Relative: 0 %
Eosinophils Absolute: 0 10*3/uL (ref 0.0–0.5)
Eosinophils Relative: 2 %
HCT: 38.4 % (ref 36.0–46.0)
Hemoglobin: 11.8 g/dL — ABNORMAL LOW (ref 12.0–15.0)
Immature Granulocytes: 0 %
Lymphocytes Relative: 41 %
Lymphs Abs: 1.1 10*3/uL (ref 0.7–4.0)
MCH: 25.7 pg — ABNORMAL LOW (ref 26.0–34.0)
MCHC: 30.7 g/dL (ref 30.0–36.0)
MCV: 83.7 fL (ref 80.0–100.0)
Monocytes Absolute: 0.3 10*3/uL (ref 0.1–1.0)
Monocytes Relative: 11 %
Neutro Abs: 1.2 10*3/uL — ABNORMAL LOW (ref 1.7–7.7)
Neutrophils Relative %: 46 %
Platelets: 255 10*3/uL (ref 150–400)
RBC: 4.59 MIL/uL (ref 3.87–5.11)
RDW: 14.1 % (ref 11.5–15.5)
WBC: 2.6 10*3/uL — ABNORMAL LOW (ref 4.0–10.5)
nRBC: 0 % (ref 0.0–0.2)

## 2019-10-23 LAB — POCT ACTIVATED CLOTTING TIME
Activated Clotting Time: 208 seconds
Activated Clotting Time: 224 seconds
Activated Clotting Time: 142 seconds

## 2019-10-23 LAB — PROTIME-INR
INR: 1 (ref 0.8–1.2)
Prothrombin Time: 12.9 seconds (ref 11.4–15.2)

## 2019-10-23 LAB — PLATELET INHIBITION P2Y12: Platelet Function  P2Y12: 173 [PRU] — ABNORMAL LOW (ref 182–335)

## 2019-10-23 SURGERY — RADIOLOGY WITH ANESTHESIA
Anesthesia: General

## 2019-10-23 MED ORDER — LISINOPRIL 10 MG PO TABS: 10.0000 mg | ORAL_TABLET | Freq: Once | ORAL | Status: AC

## 2019-10-23 MED ORDER — LACTATED RINGERS IV SOLN: INTRAVENOUS | Status: DC

## 2019-10-23 MED ORDER — ONDANSETRON HCL 4 MG/2ML IJ SOLN
INTRAMUSCULAR | Status: DC | PRN
  Administered 2019-10-23: 4 mg via INTRAVENOUS

## 2019-10-23 MED ORDER — CLEVIDIPINE BUTYRATE 0.5 MG/ML IV EMUL
0.0000 mg/h | INTRAVENOUS | Status: DC
  Administered 2019-10-23: 18 mg/h via INTRAVENOUS
  Administered 2019-10-23: 5 mg/h via INTRAVENOUS
  Administered 2019-10-23: 4 mg/h via INTRAVENOUS
  Filled 2019-10-23 (×2): qty 50

## 2019-10-23 MED ORDER — CIPROFLOXACIN IN D5W 400 MG/200ML IV SOLN
400.0000 mg | Freq: Two times a day (BID) | INTRAVENOUS | Status: AC
  Administered 2019-10-23 – 2019-10-24 (×2): 400 mg via INTRAVENOUS
  Filled 2019-10-23 (×2): qty 200

## 2019-10-23 MED ORDER — HEPARIN SODIUM (PORCINE) 1000 UNIT/ML IJ SOLN
INTRAMUSCULAR | Status: AC
  Filled 2019-10-23: qty 1

## 2019-10-23 MED ORDER — ASPIRIN 81 MG PO CHEW: 324.0000 mg | CHEWABLE_TABLET | Freq: Every day | ORAL | Status: DC

## 2019-10-23 MED ORDER — CLOPIDOGREL BISULFATE 75 MG PO TABS: 150.0000 mg | ORAL_TABLET | Freq: Every day | ORAL | Status: DC

## 2019-10-23 MED ORDER — LIDOCAINE 2% (20 MG/ML) 5 ML SYRINGE
INTRAMUSCULAR | Status: DC | PRN
  Administered 2019-10-23: 40 mg via INTRAVENOUS

## 2019-10-23 MED ORDER — ACETAMINOPHEN 325 MG PO TABS
650.0000 mg | ORAL_TABLET | ORAL | Status: DC | PRN
  Administered 2019-10-24: 650 mg via ORAL
  Filled 2019-10-23: qty 2

## 2019-10-23 MED ORDER — HEPARIN SODIUM (PORCINE) 1000 UNIT/ML IJ SOLN
INTRAMUSCULAR | Status: DC | PRN
  Administered 2019-10-23: 3000 [IU] via INTRAVENOUS
  Administered 2019-10-23: 2000 [IU] via INTRAVENOUS

## 2019-10-23 MED ORDER — FENTANYL CITRATE (PF) 100 MCG/2ML IJ SOLN
INTRAMUSCULAR | Status: AC
  Filled 2019-10-23: qty 2

## 2019-10-23 MED ORDER — HEPARIN (PORCINE) 25000 UT/250ML-% IV SOLN: 500.0000 [IU]/h | INTRAVENOUS | Status: DC

## 2019-10-23 MED ORDER — EPTIFIBATIDE 20 MG/10ML IV SOLN
INTRAVENOUS | Status: AC | PRN
  Administered 2019-10-23: 3 mg via INTRAVENOUS
  Administered 2019-10-23: 1.5 mg via INTRAVENOUS

## 2019-10-23 MED ORDER — CLOPIDOGREL BISULFATE 75 MG PO TABS: 75.0000 mg | ORAL_TABLET | Freq: Every day | ORAL | Status: DC

## 2019-10-23 MED ORDER — ROCURONIUM BROMIDE 10 MG/ML (PF) SYRINGE
PREFILLED_SYRINGE | INTRAVENOUS | Status: DC | PRN
  Administered 2019-10-23: 60 mg via INTRAVENOUS
  Administered 2019-10-23: 20 mg via INTRAVENOUS

## 2019-10-23 MED ORDER — INFLUENZA VAC SPLIT QUAD 0.5 ML IM SUSY
0.5000 mL | PREFILLED_SYRINGE | INTRAMUSCULAR | Status: AC
  Administered 2019-10-24: 0.5 mL via INTRAMUSCULAR
  Filled 2019-10-23: qty 0.5

## 2019-10-23 MED ORDER — CLOPIDOGREL BISULFATE 75 MG PO TABS
ORAL_TABLET | ORAL | Status: AC
  Filled 2019-10-23: qty 2

## 2019-10-23 MED ORDER — CHLORHEXIDINE GLUCONATE 0.12 % MT SOLN
OROMUCOSAL | Status: AC
  Administered 2019-10-23: 15 mL via OROMUCOSAL
  Filled 2019-10-23: qty 15

## 2019-10-23 MED ORDER — ACETAMINOPHEN 160 MG/5ML PO SOLN: 1000.0000 mg | Freq: Once | ORAL | Status: DC | PRN

## 2019-10-23 MED ORDER — EPTIFIBATIDE 20 MG/10ML IV SOLN
INTRAVENOUS | Status: AC
  Filled 2019-10-23: qty 10

## 2019-10-23 MED ORDER — ACETAMINOPHEN 10 MG/ML IV SOLN: 1000.0000 mg | Freq: Once | INTRAVENOUS | Status: DC | PRN

## 2019-10-23 MED ORDER — ACETAMINOPHEN 160 MG/5ML PO SOLN: 650.0000 mg | ORAL | Status: DC | PRN

## 2019-10-23 MED ORDER — CEFAZOLIN SODIUM-DEXTROSE 2-4 GM/100ML-% IV SOLN
2.0000 g | INTRAVENOUS | Status: AC
  Administered 2019-10-23: 2 g via INTRAVENOUS
  Filled 2019-10-23: qty 100

## 2019-10-23 MED ORDER — DEXAMETHASONE SODIUM PHOSPHATE 10 MG/ML IJ SOLN
INTRAMUSCULAR | Status: DC | PRN
  Administered 2019-10-23: 5 mg via INTRAVENOUS

## 2019-10-23 MED ORDER — ACETAMINOPHEN 500 MG PO TABS: 1000.0000 mg | ORAL_TABLET | Freq: Once | ORAL | Status: DC | PRN

## 2019-10-23 MED ORDER — LABETALOL HCL 5 MG/ML IV SOLN
INTRAVENOUS | Status: DC | PRN
  Administered 2019-10-23: 10 mg via INTRAVENOUS

## 2019-10-23 MED ORDER — PHENYLEPHRINE 40 MCG/ML (10ML) SYRINGE FOR IV PUSH (FOR BLOOD PRESSURE SUPPORT)
PREFILLED_SYRINGE | INTRAVENOUS | Status: DC | PRN
  Administered 2019-10-23: 80 ug via INTRAVENOUS

## 2019-10-23 MED ORDER — LISINOPRIL 10 MG PO TABS: 10.0000 mg | ORAL_TABLET | Freq: Once | ORAL | Status: DC

## 2019-10-23 MED ORDER — MIDAZOLAM HCL 2 MG/2ML IJ SOLN
INTRAMUSCULAR | Status: AC
  Filled 2019-10-23: qty 2

## 2019-10-23 MED ORDER — CLOPIDOGREL BISULFATE 75 MG PO TABS: 150.0000 mg | ORAL_TABLET | Freq: Once | ORAL | Status: DC

## 2019-10-23 MED ORDER — FENTANYL CITRATE (PF) 100 MCG/2ML IJ SOLN: 25.0000 ug | INTRAMUSCULAR | Status: DC | PRN

## 2019-10-23 MED ORDER — CLEVIDIPINE BUTYRATE 0.5 MG/ML IV EMUL
INTRAVENOUS | Status: AC
  Filled 2019-10-23: qty 50

## 2019-10-23 MED ORDER — EPHEDRINE SULFATE-NACL 50-0.9 MG/10ML-% IV SOSY
PREFILLED_SYRINGE | INTRAVENOUS | Status: DC | PRN
  Administered 2019-10-23: 5 mg via INTRAVENOUS
  Administered 2019-10-23: 2.5 mg via INTRAVENOUS
  Administered 2019-10-23: 5 mg via INTRAVENOUS

## 2019-10-23 MED ORDER — LISINOPRIL 10 MG PO TABS
ORAL_TABLET | ORAL | Status: AC
  Administered 2019-10-23: 10 mg
  Filled 2019-10-23: qty 1

## 2019-10-23 MED ORDER — SODIUM CHLORIDE 0.9 % IV SOLN: INTRAVENOUS | Status: DC

## 2019-10-23 MED ORDER — OXYCODONE HCL 5 MG PO TABS: 5.0000 mg | ORAL_TABLET | Freq: Once | ORAL | Status: DC | PRN

## 2019-10-23 MED ORDER — NIMODIPINE 30 MG PO CAPS: 0.0000 mg | ORAL_CAPSULE | ORAL | Status: DC

## 2019-10-23 MED ORDER — VERAPAMIL HCL 2.5 MG/ML IV SOLN: INTRA_ARTERIAL | Status: AC | PRN

## 2019-10-23 MED ORDER — IOHEXOL 300 MG/ML  SOLN
150.0000 mL | Freq: Once | INTRAMUSCULAR | Status: AC | PRN
  Administered 2019-10-23: 50 mL via INTRA_ARTERIAL

## 2019-10-23 MED ORDER — ACETAMINOPHEN 650 MG RE SUPP: 650.0000 mg | RECTAL | Status: DC | PRN

## 2019-10-23 MED ORDER — CLOPIDOGREL BISULFATE 75 MG PO TABS
75.0000 mg | ORAL_TABLET | Freq: Every day | ORAL | Status: DC
  Administered 2019-10-24: 75 mg via ORAL
  Filled 2019-10-23: qty 1

## 2019-10-23 MED ORDER — LIDOCAINE HCL 1 % IJ SOLN
INTRAMUSCULAR | Status: AC | PRN
  Administered 2019-10-23: 3 mL

## 2019-10-23 MED ORDER — CLEVIDIPINE BUTYRATE 0.5 MG/ML IV EMUL
INTRAVENOUS | Status: DC | PRN
  Administered 2019-10-23: 1 mg/h via INTRAVENOUS

## 2019-10-23 MED ORDER — SODIUM CHLORIDE 0.9 % IV SOLN
0.0125 ug/kg/min | INTRAVENOUS | Status: AC
  Administered 2019-10-23: .05 ug/kg/min via INTRAVENOUS
  Filled 2019-10-23: qty 2000

## 2019-10-23 MED ORDER — HEPARIN (PORCINE) 25000 UT/250ML-% IV SOLN
600.0000 [IU]/h | INTRAVENOUS | Status: DC
  Administered 2019-10-23: 500 [IU]/h via INTRAVENOUS
  Filled 2019-10-23: qty 250

## 2019-10-23 MED ORDER — VERAPAMIL HCL 2.5 MG/ML IV SOLN
INTRAVENOUS | Status: AC
  Filled 2019-10-23: qty 2

## 2019-10-23 MED ORDER — HEPARIN (PORCINE) 25000 UT/250ML-% IV SOLN
INTRAVENOUS | Status: AC
  Filled 2019-10-23: qty 250

## 2019-10-23 MED ORDER — PROPOFOL 10 MG/ML IV BOLUS
INTRAVENOUS | Status: DC | PRN
  Administered 2019-10-23: 100 mg via INTRAVENOUS
  Administered 2019-10-23: 30 mg via INTRAVENOUS

## 2019-10-23 MED ORDER — IOHEXOL 300 MG/ML  SOLN
150.0000 mL | Freq: Once | INTRAMUSCULAR | Status: AC | PRN
  Administered 2019-10-23: 75 mL via INTRA_ARTERIAL

## 2019-10-23 MED ORDER — ASPIRIN 325 MG PO TABS
325.0000 mg | ORAL_TABLET | Freq: Every day | ORAL | Status: DC
  Administered 2019-10-24: 325 mg via ORAL
  Filled 2019-10-23: qty 1

## 2019-10-23 MED ORDER — ASPIRIN 325 MG PO TABS: 325.0000 mg | ORAL_TABLET | Freq: Every day | ORAL | Status: DC

## 2019-10-23 MED ORDER — OXYCODONE HCL 5 MG/5ML PO SOLN: 5.0000 mg | Freq: Once | ORAL | Status: DC | PRN

## 2019-10-23 MED ORDER — PHENYLEPHRINE HCL-NACL 10-0.9 MG/250ML-% IV SOLN
INTRAVENOUS | Status: DC | PRN
  Administered 2019-10-23: 20 ug/min via INTRAVENOUS

## 2019-10-23 MED ORDER — SUGAMMADEX SODIUM 200 MG/2ML IV SOLN
INTRAVENOUS | Status: DC | PRN
  Administered 2019-10-23: 200 mg via INTRAVENOUS

## 2019-10-23 MED ORDER — CLOPIDOGREL BISULFATE 75 MG PO TABS: 75.0000 mg | ORAL_TABLET | ORAL | Status: DC

## 2019-10-23 MED ORDER — MIDAZOLAM HCL 2 MG/2ML IJ SOLN
INTRAMUSCULAR | Status: DC | PRN
  Administered 2019-10-23: 1 mg via INTRAVENOUS

## 2019-10-23 MED ORDER — CHLORHEXIDINE GLUCONATE CLOTH 2 % EX PADS
6.0000 | MEDICATED_PAD | Freq: Every day | CUTANEOUS | Status: DC
  Administered 2019-10-23: 6 via TOPICAL

## 2019-10-23 MED ORDER — LACTATED RINGERS IV SOLN: INTRAVENOUS | Status: DC | PRN

## 2019-10-23 MED ORDER — CHLORHEXIDINE GLUCONATE 0.12 % MT SOLN: 15.0000 mL | Freq: Once | OROMUCOSAL | Status: AC

## 2019-10-23 MED ORDER — CLOPIDOGREL BISULFATE 75 MG PO TABS
150.0000 mg | ORAL_TABLET | Freq: Once | ORAL | Status: AC
  Administered 2019-10-23: 150 mg via ORAL

## 2019-10-23 MED ORDER — ORAL CARE MOUTH RINSE: 15.0000 mL | Freq: Once | OROMUCOSAL | Status: AC

## 2019-10-23 MED ORDER — ASPIRIN EC 325 MG PO TBEC
325.0000 mg | DELAYED_RELEASE_TABLET | ORAL | Status: AC
  Administered 2019-10-23: 325 mg via ORAL
  Filled 2019-10-23: qty 1

## 2019-10-23 MED ORDER — FENTANYL CITRATE (PF) 250 MCG/5ML IJ SOLN
INTRAMUSCULAR | Status: DC | PRN
  Administered 2019-10-23: 100 ug via INTRAVENOUS

## 2019-10-23 MED ORDER — ASPIRIN 325 MG PO TABS: 325.0000 mg | ORAL_TABLET | Freq: Once | ORAL | Status: DC

## 2019-10-23 MED ORDER — LIDOCAINE HCL (PF) 1 % IJ SOLN
INTRAMUSCULAR | Status: AC
  Filled 2019-10-23: qty 30

## 2019-10-23 NOTE — Progress Notes (Signed)
ANTICOAGULATION CONSULT NOTE - Follow Up Consult  Pharmacy Consult for heparin Indication: Post neuro interventional procedure  Allergies  Allergen Reactions  . Sulfamethoxazole Hives, Rash and Other (See Comments)    REACTION: vomiting    Patient Measurements: Height: 5\' 2"  (157.5 cm) Weight: 65.8 kg (145 lb) IBW/kg (Calculated) : 50.1 Heparin Dosing Weight: 65.8 kg   Vital Signs: Temp: 97.5 F (36.4 C) (10/21 0629) Temp Source: Temporal (10/21 0629) BP: 156/73 (10/21 0629) Pulse Rate: 84 (10/21 1245)  Labs: Recent Labs    10/23/19 0609  HGB 11.8*  HCT 38.4  PLT 255  LABPROT 12.9  INR 1.0  CREATININE 0.76    Estimated Creatinine Clearance: 63.3 mL/min (by C-G formula based on SCr of 0.76 mg/dL).   Medications:  Medications Prior to Admission  Medication Sig Dispense Refill Last Dose  . aspirin EC 81 MG tablet Take 81 mg by mouth daily. Swallow whole.   10/22/2019 at Unknown time  . clopidogrel (PLAVIX) 75 MG tablet Take 75 mg by mouth daily.   10/22/2019 at Unknown time  . lisinopril (ZESTRIL) 10 MG tablet Take 10 mg by mouth daily.   10/22/2019 at Unknown time  . nitrofurantoin, macrocrystal-monohydrate, (MACROBID) 100 MG capsule Take 1 capsule (100 mg total) by mouth 2 (two) times daily. (Patient not taking: Reported on 10/17/2019) 10 capsule 0 Not Taking at Unknown time    Assessment: 84 YOF s/p recanalization of L ICA intracranial aneurysm with a flow pipeline device to remain on IV heparin until until sheath removal at 8 AM tomorrow.  H/H and Plt wnl. SCr wnl   Goal of Therapy:  Heparin level 0.1-0.25 units/ml Monitor platelets by anticoagulation protocol: Yes   Plan:  -Increase IV heparin slightly to 600 units/hr -F/u 6 hr HL -Monitor closely for any s/s of bleeding   Albertina Parr, PharmD., BCPS, BCCCP Clinical Pharmacist Please refer to [facility] for unit-specific pharmacist

## 2019-10-23 NOTE — Transfer of Care (Signed)
Immediate Anesthesia Transfer of Care Note  Patient: Krystal Houston  Procedure(s) Performed: RADIOLOGY WITH ANESTHESIA  EMBOLIZATION (N/A )  Patient Location: PACU  Anesthesia Type:General  Level of Consciousness: awake, alert  and oriented  Airway & Oxygen Therapy: Patient Spontanous Breathing and Patient connected to face mask oxygen  Post-op Assessment: Report given to RN and Post -op Vital signs reviewed and stable  Post vital signs: Reviewed and stable  Last Vitals:  Vitals Value Taken Time  BP    Temp    Pulse 85 10/23/19 1248  Resp 10 10/23/19 1248  SpO2 100 % 10/23/19 1248  Vitals shown include unvalidated device data.  Last Pain:  Vitals:   10/23/19 0651  TempSrc:   PainSc: 0-No pain      Patients Stated Pain Goal: 2 (67/73/73 6681)  Complications: No complications documented.

## 2019-10-23 NOTE — Anesthesia Procedure Notes (Signed)

## 2019-10-23 NOTE — Anesthesia Preprocedure Evaluation (Signed)
Anesthesia Evaluation  Patient identified by MRN, date of birth, ID band Patient awake    Reviewed: Allergy & Precautions, NPO status , Patient's Chart, lab work & pertinent test results  History of Anesthesia Complications Negative for: history of anesthetic complications  Airway Mallampati: II  TM Distance: >3 FB Neck ROM: Full    Dental  (+) Dental Advisory Given   Pulmonary neg pulmonary ROS,    breath sounds clear to auscultation       Cardiovascular hypertension, Pt. on medications (-) angina(-) Past MI and (-) CHF  Rhythm:Regular     Neuro/Psych  Headaches,  left internal carotid artery paraclinoid aneurysm negative psych ROS   GI/Hepatic negative GI ROS, Neg liver ROS,   Endo/Other  negative endocrine ROS  Renal/GU negative Renal ROS     Musculoskeletal negative musculoskeletal ROS (+)   Abdominal   Peds  Hematology negative hematology ROS (+)   Anesthesia Other Findings   Reproductive/Obstetrics                             Anesthesia Physical Anesthesia Plan  ASA: II  Anesthesia Plan: General   Post-op Pain Management:    Induction: Intravenous  PONV Risk Score and Plan: 3 and Ondansetron and Dexamethasone  Airway Management Planned: Oral ETT  Additional Equipment: Arterial line  Intra-op Plan:   Post-operative Plan: Extubation in OR  Informed Consent: I have reviewed the patients History and Physical, chart, labs and discussed the procedure including the risks, benefits and alternatives for the proposed anesthesia with the patient or authorized representative who has indicated his/her understanding and acceptance.     Dental advisory given  Plan Discussed with: CRNA and Surgeon  Anesthesia Plan Comments:         Anesthesia Quick Evaluation

## 2019-10-23 NOTE — Procedures (Addendum)
S/P Lt common carotid arteriogram Rt Rad approach. S/P embolization of  recanalized  large Lt ICA intracranial aneurysm with a 4.5 mm x 16 mm pipeline flex shield flow diversion. Post CT  No ICH. Extubated.  Denies H/A,N/V ,visual ,speech or motor difficulties Appropriately responsive, Moves all 4s equally.  Pupils 53mm RT = LT no facial asymmetry.   RT radial wrist band for hemostasis. Distal radial pulse intact. S.Misao Fackrell MD

## 2019-10-23 NOTE — Anesthesia Procedure Notes (Signed)
Arterial Line Insertion Start/End10/21/2021 8:50 AM, 10/23/2019 9:00 AM Performed by: Dorthea Cove, CRNA, CRNA  Patient location: Pre-op. Preanesthetic checklist: patient identified, IV checked, site marked, risks and benefits discussed, surgical consent, monitors and equipment checked, pre-op evaluation, timeout performed and anesthesia consent Lidocaine 1% used for infiltration Left, radial was placed Catheter size: 20 Fr Hand hygiene performed  and maximum sterile barriers used   Attempts: 1 Procedure performed without using ultrasound guided technique. Following insertion, dressing applied. Post procedure assessment: normal and unchanged  Patient tolerated the procedure well with no immediate complications.

## 2019-10-23 NOTE — H&P (Signed)
Chief Complaint: Patient was seen in consultation today for left paraclinoid internal carotid artery aneurysm   Referring Physician(s): Deveshwar, Willaim Rayas  Supervising Physician: [name]  Patient Status: [facility] - Out-pt  History of Present Illness: Krystal Houston is a 64 y.o. female with a medical history significant for headaches, HTN and previous aneurysm treatments. She has been routinely followed with diagnostic angiograms. She presented to her PCP earlier this year with complaints of progressively worsening memory difficulties as well as left-sided temporoparietal pain. She underwent a diagnostic cerebral arteriogram 08/19/19 and met with Dr. Estanislado Pandy  08/26/19 to discuss the results and treatment options. 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 The recanalized portion measured approximately 5.2 mm x 3 mm and 4.8 mm x 2.5 mm confirmed on 3D rotational arteriogram. Compared to the previous arteriogram done 02/14/2012 worsening recanalization was evident. Near complete occlusion of the right internal carotid artery was also noted.   Given these findings, it was felt that endovascular treatment would be the optimal choice and the patient was in agreement to proceed. The patient presents today to the [facility] Neuro Interventional Radiology department for endovascular aneurysm treatment.    Past Medical History:  Diagnosis Date  . Brain aneurysm   . Headache   . Hyperlipidemia   . Hypertension    Patient denies  . Leukocytosis    she reports she has had this her whole life, on labs back to 2009    Past Surgical History:  Procedure Laterality Date  . ABDOMINAL HYSTERECTOMY    . CEREBRAL ANEURYSM REPAIR    . IR 3D INDEPENDENT WKST  08/19/2019  . IR ANGIO INTRA EXTRACRAN SEL COM CAROTID INNOMINATE BILAT MOD SED  08/19/2019  . IR ANGIO VERTEBRAL SEL VERTEBRAL BILAT MOD SED   08/19/2019  . IR US GUIDE VASC ACCESS RIGHT  08/19/2019  . vaginal deliveries     x 3    Allergies: Sulfamethoxazole  Medications: Prior to Admission medications   Medication Sig Start Date End Date Taking? Authorizing Provider  aspirin EC 81 MG tablet Take 81 mg by mouth daily. Swallow whole.    [provider]  clopidogrel (PLAVIX) 75 MG tablet Take 75 mg by mouth daily. 10/14/19   [provider]  lisinopril (ZESTRIL) 10 MG tablet Take 10 mg by mouth daily.    [provider]  nitrofurantoin, macrocrystal-monohydrate, (MACROBID) 100 MG capsule Take 1 capsule (100 mg total) by mouth 2 (two) times daily. Patient not taking: Reported on 10/17/2019 09/25/19   Lestine Box, PA-C     Family History  Problem Relation Age of Onset  . Heart disease Mother   . Diabetes Father   . Coronary artery disease Other   . Diabetes Other   . Colon cancer Neg Hx   . Esophageal cancer Neg Hx   . Rectal cancer Neg Hx   . Stomach cancer Neg Hx     Social History   Socioeconomic History  . Marital status: Legally Separated    Spouse name: Not on file  . Number of children: Not on file  . Years of education: Not on file  . Highest education level: Not on file  Occupational History  . Not on file  Tobacco Use  . Smoking status: Never Smoker  . Smokeless tobacco: Never Used  Vaping Use  . Vaping Use: Never used  Substance and Sexual Activity  . Alcohol use: No  .  Drug use: No  . Sexual activity: Not Currently    Birth control/protection: Surgical    Comment: Hysterectomy  Other Topics Concern  . Not on file  Social History Narrative  . Not on file   Social Determinants of Health   Financial Resource Strain:   . Difficulty of Paying Living Expenses: Not on file  Food Insecurity:   . Worried About Programme researcher, broadcasting/film/video in the Last Year: Not on file  . Ran Out of Food in the Last Year: Not on file  Transportation Needs:   . Lack of Transportation  (Medical): Not on file  . Lack of Transportation (Non-Medical): Not on file  Physical Activity:   . Days of Exercise per Week: Not on file  . Minutes of Exercise per Session: Not on file  Stress:   . Feeling of Stress : Not on file  Social Connections:   . Frequency of Communication with Friends and Family: Not on file  . Frequency of Social Gatherings with Friends and Family: Not on file  . Attends Religious Services: Not on file  . Active Member of Clubs or Organizations: Not on file  . Attends Banker Meetings: Not on file  . Marital Status: Not on file    Review of Systems: A 12 point ROS discussed and pertinent positives are indicated in the HPI above.  All other systems are negative.  Review of Systems  Constitutional: Negative for appetite change and fatigue.  Respiratory: Negative for cough and shortness of breath.   Cardiovascular: Negative for chest pain and leg swelling.  Gastrointestinal: Negative for abdominal pain, diarrhea, nausea and vomiting.  Musculoskeletal: Negative for back pain.  Neurological: Negative for dizziness, tremors, weakness and headaches.    Vital Signs: Temp: 97.5  BP 156/73  HR 54  RR 18   Physical Exam Constitutional:      General: She is not in acute distress. HENT:     Mouth/Throat:     Mouth: Mucous membranes are moist.     Pharynx: Oropharynx is clear.  Cardiovascular:     Rate and Rhythm: Normal rate and regular rhythm.     Pulses: Normal pulses.     Heart sounds: Normal heart sounds.  Pulmonary:     Effort: Pulmonary effort is normal.     Breath sounds: Normal breath sounds.  Abdominal:     General: Bowel sounds are normal.     Palpations: Abdomen is soft.  Musculoskeletal:        General: Normal range of motion.  Skin:    General: Skin is warm and dry.  Neurological:     General: No focal deficit present.     Mental Status: She is alert and oriented to person, place, and time.     Cranial Nerves: No  cranial nerve deficit.     Motor: No weakness.     Coordination: Coordination normal.  Psychiatric:        Mood and Affect: Mood normal.        Behavior: Behavior normal.        Thought Content: Thought content normal.        Judgment: Judgment normal.     Imaging: No results found.  Labs:  CBC: Recent Labs    08/19/19 0640 10/23/19 0609  WBC 3.3* 2.6*  HGB 12.6 11.8*  HCT 40.2 38.4  PLT 286 255    COAGS: Recent Labs    08/19/19 0640 10/23/19 0609  INR 0.9 1.0  BMP: Recent Labs    08/19/19 0640 10/23/19 0609  NA 142 143  K 3.6 3.7  CL 108 110  CO2 25 24  GLUCOSE 105* 107*  BUN 6* 8  CALCIUM 9.1 9.0  CREATININE 0.76 0.76  GFRNONAA >60 >60  GFRAA >60  --     LIVER FUNCTION TESTS: No results for input(s): BILITOT, AST, ALT, ALKPHOS, PROT, ALBUMIN in the last 8760 hours.  TUMOR MARKERS: No results for input(s): AFPTM, CEA, CA199, CHROMGRNA in the last 8760 hours.  Assessment and Plan:  Left paraclinoid internal carotid artery aneurysm: Jkayla L. Blackstock, 64 year old female, presents today for endovascular treatment of a left paraclinoid internal carotid artery aneurysm. This procedure will be done with general anesthesia and the patient will be admitted to the ICU after the procedure. She has been taking Plavix since last Monday and she will receive an additional 150 mg of Plavix and 325 mg of aspirin prior to the procedure. She will also receive 10 mg of Lisinopril.  Risks and benefits of this procedure were discussed with the patient including, but not limited to bleeding, infection, vascular injury or contrast induced renal failure.  This interventional procedure involves the use of X-rays and because of the nature of the planned procedure, it is possible that we will have prolonged use of X-ray fluoroscopy.  Potential radiation risks to you include (but are not limited to) the following: - A slightly elevated risk for cancer  several years later  in life. This risk is typically less than 0.5% percent. This risk is low in comparison to the normal incidence of human cancer, which is 33% for women and 50% for men according to the [place]. - Radiation induced injury can include skin redness, resembling a rash, tissue breakdown / ulcers and hair loss (which can be temporary or permanent).   The likelihood of either of these occurring depends on the difficulty of the procedure and whether you are sensitive to radiation due to previous procedures, disease, or genetic conditions.   IF your procedure requires a prolonged use of radiation, you will be notified and given written instructions for further action.  It is your responsibility to monitor the irradiated area for the 2 weeks following the procedure and to notify your physician if you are concerned that you have suffered a radiation induced injury.    All of the patient's questions were answered, patient is agreeable to proceed.  The patient has been NPO. Labs and vitals have been reviewed. Consent signed and in chart.  Thank you for this interesting consult.  I greatly enjoyed meeting [employer] and look forward to participating in their care.  A copy of this report was sent to the requesting provider on this date.  Electronically Signed: Soyla Dryer, AGACNP-BC [phone] 10/23/2019, 8:32 AM   I spent a total of 40 Minutes    in face to face in clinical consultation, greater than 50% of which was counseling/coordinating care for endovascular aneurysm treatment.

## 2019-10-24 ENCOUNTER — Encounter (HOSPITAL_COMMUNITY): Payer: Self-pay | Admitting: Interventional Radiology

## 2019-10-24 DIAGNOSIS — Z8249 Family history of ischemic heart disease and other diseases of the circulatory system: Secondary | ICD-10-CM | POA: Diagnosis not present

## 2019-10-24 DIAGNOSIS — Z79899 Other long term (current) drug therapy: Secondary | ICD-10-CM | POA: Diagnosis not present

## 2019-10-24 DIAGNOSIS — Z23 Encounter for immunization: Secondary | ICD-10-CM | POA: Diagnosis not present

## 2019-10-24 DIAGNOSIS — Z7902 Long term (current) use of antithrombotics/antiplatelets: Secondary | ICD-10-CM | POA: Diagnosis not present

## 2019-10-24 DIAGNOSIS — I10 Essential (primary) hypertension: Secondary | ICD-10-CM | POA: Diagnosis not present

## 2019-10-24 DIAGNOSIS — I671 Cerebral aneurysm, nonruptured: Secondary | ICD-10-CM | POA: Diagnosis not present

## 2019-10-24 DIAGNOSIS — Z882 Allergy status to sulfonamides status: Secondary | ICD-10-CM | POA: Diagnosis not present

## 2019-10-24 DIAGNOSIS — Z7982 Long term (current) use of aspirin: Secondary | ICD-10-CM | POA: Diagnosis not present

## 2019-10-24 LAB — CBC WITH DIFFERENTIAL/PLATELET
Abs Immature Granulocytes: 0.01 10*3/uL (ref 0.00–0.07)
Basophils Absolute: 0 10*3/uL (ref 0.0–0.1)
Basophils Relative: 0 %
Eosinophils Absolute: 0 10*3/uL (ref 0.0–0.5)
Eosinophils Relative: 0 %
HCT: 32 % — ABNORMAL LOW (ref 36.0–46.0)
Hemoglobin: 10.4 g/dL — ABNORMAL LOW (ref 12.0–15.0)
Immature Granulocytes: 0 %
Lymphocytes Relative: 23 %
Lymphs Abs: 1.2 10*3/uL (ref 0.7–4.0)
MCH: 26.7 pg (ref 26.0–34.0)
MCHC: 32.5 g/dL (ref 30.0–36.0)
MCV: 82.3 fL (ref 80.0–100.0)
Monocytes Absolute: 0.6 10*3/uL (ref 0.1–1.0)
Monocytes Relative: 11 %
Neutro Abs: 3.4 10*3/uL (ref 1.7–7.7)
Neutrophils Relative %: 66 %
Platelets: 235 10*3/uL (ref 150–400)
RBC: 3.89 MIL/uL (ref 3.87–5.11)
RDW: 14.2 % (ref 11.5–15.5)
WBC: 5.2 10*3/uL (ref 4.0–10.5)
nRBC: 0 % (ref 0.0–0.2)

## 2019-10-24 LAB — BASIC METABOLIC PANEL
Anion gap: 9 (ref 5–15)
BUN: 9 mg/dL (ref 8–23)
CO2: 22 mmol/L (ref 22–32)
Calcium: 8.3 mg/dL — ABNORMAL LOW (ref 8.9–10.3)
Chloride: 108 mmol/L (ref 98–111)
Creatinine, Ser: 0.69 mg/dL (ref 0.44–1.00)
GFR, Estimated: >60 mL/min (ref >60)
Glucose, Bld: 94 mg/dL (ref 70–99)
Potassium: 3.5 mmol/L (ref 3.5–5.1)
Sodium: 139 mmol/L (ref 135–145)

## 2019-10-24 LAB — MRSA PCR SCREENING: MRSA by PCR: NEGATIVE

## 2019-10-24 MED ORDER — ASPIRIN 325 MG PO TABS
325.0000 mg | ORAL_TABLET | Freq: Every day | ORAL | 3 refills | Status: DC
Start: 2019-10-25 — End: 2021-02-04

## 2019-10-24 MED ORDER — CIPROFLOXACIN HCL 500 MG PO TABS: ORAL_TABLET | ORAL | 0 refills | Status: DC

## 2019-10-24 NOTE — Plan of Care (Signed)
Pt able to complete ADLs independently. A&O x 4, pleasant, neuro intact.

## 2019-10-24 NOTE — Discharge Summary (Signed)
Krystal Houston ID: Krystal Houston MRN: [id number] DOB/AGE: Apr 20, 1955 64 y.o.  Admit date: 10/23/2019 Discharge date: 10/24/2019  Supervising Physician: Krystal Houston  Krystal Houston Status: [facility] - In-pt  Admission Diagnoses: Brain aneurysm  Discharge Diagnoses:  Active Problems:   Brain aneurysm   Discharged Condition: stable  Hospital Course:  Krystal Houston presented to [facility] 10/23/2019 for an image-guided cerebral arteriogram with staged embolization of left ICA intracranial aneurysm using a pipeline flex flow diverter via right radial approach by Dr. Estanislado Houston. Procedure occurred without major complications and Krystal Houston was transferred to neuro ICU for overnight observation. Following procedure, Krystal Houston noted to have hematuria in urine. Of note, Krystal Houston states Krystal Houston was treated for UTI last week (Macrobid). UA revealing moderate hemoglobin in urine. Krystal Houston was started on IV Ciprofloxacin and has completed 2 doses of this prior to discharge). No additional major events occurred overnight.  Krystal Houston awake and alert sitting in bed watching TV with no complaints at this time. States Krystal Houston had a headache last evening but it has subsided at this time. Denies headache, N/V, weakness, numbness/tingling, dizziness, or vision changes. Right radial puncture site stable. Plan to discharge home today and follow-up with Dr. Estanislado Houston in clinic 2 weeks after discharge.  Krystal Houston will be discharged on PO Ciprofloxacin x 3 days. Krystal Houston will follow-up with PCP next week (following completion of antibiotic course) for repeat urinalysis.   Consults: None  Significant Diagnostic Studies: No results found.  Treatments: Endovascular embolization of left ICA aneurysm using a pipeline flex flow diverter  Discharge Exam: Blood pressure 113/70, pulse 67, temperature 98.8 F (37.1 C), temperature source Oral, resp. rate 15, height 5\' 2"  (1.575 m), weight 145 lb (65.8 kg), SpO2 96 %. Physical Exam Vitals and nursing  note reviewed.  Constitutional:      General: Krystal Houston is not in acute distress.    Appearance: Normal appearance.  Pulmonary:     Effort: Pulmonary effort is normal. No respiratory distress.  Skin:    General: Skin is warm and dry.     Comments: Right radial puncture site soft without active bleeding or hematoma.  Neurological:     Mental Status: Krystal Houston is alert.     Comments: Alert, awake, and oriented x3. Speech and comprehension intact. PERRL bilaterally. EOMs intact bilaterally without nystagmus or subjective diplopia. No facial asymmetry. Tongue midline. Motor power symmetric proportional to effort. No pronator drift. Fine motor and coordination intact and symmetric. Distal pulses (DPs) 2+ bilaterally.     Disposition: Discharge disposition: 01-Home or Self Care       Discharge Instructions    Call MD for:  difficulty breathing, headache or visual disturbances   Complete by: As directed    Call MD for:  extreme fatigue   Complete by: As directed    Call MD for:  hives   Complete by: As directed    Call MD for:  persistant dizziness or light-headedness   Complete by: As directed    Call MD for:  persistant nausea and vomiting   Complete by: As directed    Call MD for:  redness, tenderness, or signs of infection (pain, swelling, redness, odor or green/yellow discharge around incision site)   Complete by: As directed    Call MD for:  severe uncontrolled pain   Complete by: As directed    Call MD for:  temperature >100.4   Complete by: As directed    Diet - low sodium heart healthy   Complete by: As directed  Discharge instructions   Complete by: As directed    Continue taking Plavix 75 mg once daily and Aspirin 325 mg once daily. Take Ciprofloxacin 500 mg once daily for three days. Follow-up with PCP next week (once completed with Ciprofloxacin dose) for follow-up urinalysis. Follow-up with Dr. Estanislado Houston in clinic 2 weeks after discharge (our office will call you  to set up this appointment).   Driving Restrictions   Complete by: As directed    No driving self until 2 week follow-up (ok to be passenger in car).   Increase activity slowly   Complete by: As directed    Lifting restrictions   Complete by: As directed    No stooping, bending, or lifting more than 10 pounds until 2 week follow-up.   Remove dressing in 24 hours   Complete by: As directed    From right wrist- no further dressing changes needed after this. Ensure area remains clean and dry until fully healed.     Allergies as of 10/24/2019      Reactions   Sulfamethoxazole Hives, Rash, Other (See Comments)   REACTION: vomiting      Medication List    STOP taking these medications   aspirin EC 81 MG tablet Replaced by: aspirin 325 MG tablet   nitrofurantoin (macrocrystal-monohydrate) 100 MG capsule Commonly known as: MACROBID     TAKE these medications   aspirin 325 MG tablet Take 1 tablet (325 mg total) by mouth daily. Start taking on: October 25, 2019 Replaces: aspirin EC 81 MG tablet   ciprofloxacin 500 MG tablet Commonly known as: Cipro Take one tablet by mouth once daily for three days.   clopidogrel 75 MG tablet Commonly known as: PLAVIX Take 75 mg by mouth daily.   lisinopril 10 MG tablet Commonly known as: ZESTRIL Take 10 mg by mouth daily.       Follow-up Information    Krystal Bras, MD Follow up in 2 week(s).   Specialties: Interventional Radiology, Radiology Why: Please follow-up with Dr. Estanislado Houston in clinic 2 weeks after discharge. Our office will call you to set up this appointment. Contact information: [place] [address] [phone]        Krystal Hurl, PA-C Follow up in 1 week(s).   Specialty: Family Medicine Why: Please follow-up with PCP next week (10/25-10/29) following completion of oral antibiotics for repeat urinalysis. Contact information: [address] [place] [address] [phone]                 Electronically Signed: Earley Abide, PA-C 10/24/2019, 9:23 AM   I have spent Greater Than 30 Minutes discharging Krystal Houston.

## 2019-10-24 NOTE — Discharge Instructions (Signed)
Radial Site Care This sheet gives you information about how to care for yourself after your procedure. Your health care provider may also give you more specific instructions. If you have problems or questions, contact your health care provider. What can I expect after the procedure? After the procedure, it is common to have:  Bruising that usually fades within 1-2 weeks.  Tenderness at the site. Follow these instructions at home: Wound care 1. Follow instructions from your health care provider about how to take care of your insertion site. Make sure you: ? Wash your hands with soap and water before you change your bandage (dressing). If soap and water are not available, use hand sanitizer. ? Remove dressing 24 hours after discharge, no further dressing changes needed. 2. Do not take baths, swim, or use a hot tub for 7 days post-procedure. 3. You may shower 48 hours after the procedure or as told by your health care provider. ? Gently wash the site with plain soap and water. ? Pat the area dry with a clean towel. ? Do not rub the site. This may cause bleeding. 4. Check your site every day for signs of infection. Check for: ? Redness, swelling, or pain. ? Fluid or blood. ? Warmth. ? Pus or a bad smell. Activity  Do not stoop, bend, or lift anything that is heavier than 10 lb (4.5 kg) for 2 weeks post-procedure.  Do not drive self for 2 weeks post-procedure. Contact a health care provider if you have:  A fever or chills.  You have redness, swelling, or pain around your insertion site. Get help right away if:  The catheter insertion area swells very fast.  You pass out.  You suddenly start to sweat or your skin gets clammy.  The catheter insertion area is bleeding, and the bleeding does not stop when you hold steady pressure on the area.  The area near or just beyond the catheter insertion site becomes pale, cool, tingly, or numb. These symptoms may represent a serious problem  that is an emergency. Do not wait to see if the symptoms will go away. Get medical help right away. Call your local emergency services (911 in the [place] Do not drive yourself to the hospital.  This information is not intended to replace advice given to you by your health care provider. Make sure you discuss any questions you have with your health care provider. Document Revised: 01/01/2017 Document Reviewed: 01/01/2017 Elsevier Patient Education  2020 [employer].

## 2019-10-28 NOTE — Anesthesia Postprocedure Evaluation (Signed)
Anesthesia Post Note  Patient: Teacher, music  Procedure(s) Performed: RADIOLOGY WITH ANESTHESIA  EMBOLIZATION (N/A )     Patient location during evaluation: PACU Anesthesia Type: General Level of consciousness: patient cooperative and awake Pain management: pain level controlled Vital Signs Assessment: post-procedure vital signs reviewed and stable Respiratory status: spontaneous breathing, nonlabored ventilation, respiratory function stable and patient connected to nasal cannula oxygen Cardiovascular status: blood pressure returned to baseline and stable Postop Assessment: no apparent nausea or vomiting Anesthetic complications: no   No complications documented.  Last Vitals:  Vitals:   10/24/19 0900 10/24/19 1000  BP: 111/74   Pulse: 66 74  Resp: 16 14  Temp:    SpO2: 96% 94%    Last Pain:  Vitals:   10/24/19 0800  TempSrc: Oral  PainSc:                  Jeffrey Graefe

## 2019-11-06 ENCOUNTER — Other Ambulatory Visit: Payer: Self-pay

## 2019-11-06 ENCOUNTER — Encounter: Payer: Self-pay | Admitting: Medical

## 2019-11-06 ENCOUNTER — Ambulatory Visit (HOSPITAL_COMMUNITY)
Admission: RE | Admit: 2019-11-06 | Discharge: 2019-11-06 | Disposition: A | Discharge status: Home / Self Care | Payer: BC Managed Care – PPO | Source: Ambulatory Visit | Attending: Student | Admitting: Student

## 2019-11-06 ENCOUNTER — Ambulatory Visit (INDEPENDENT_AMBULATORY_CARE_PROVIDER_SITE_OTHER): Payer: BC Managed Care – PPO | Admitting: Medical

## 2019-11-06 VITALS — BP 150/86 | Ht 61.0 in | Wt 145.0 lb

## 2019-11-06 DIAGNOSIS — N3001 Acute cystitis with hematuria: Secondary | ICD-10-CM | POA: Diagnosis not present

## 2019-11-06 DIAGNOSIS — I10 Essential (primary) hypertension: Secondary | ICD-10-CM

## 2019-11-06 DIAGNOSIS — I671 Cerebral aneurysm, nonruptured: Secondary | ICD-10-CM

## 2019-11-06 DIAGNOSIS — E785 Hyperlipidemia, unspecified: Secondary | ICD-10-CM | POA: Diagnosis not present

## 2019-11-06 LAB — POCT URINALYSIS DIP (PROADVANTAGE DEVICE)
Bilirubin, UA: NEGATIVE
Glucose, UA: NEGATIVE mg/dL
Ketones, POC UA: NEGATIVE mg/dL
Leukocytes, UA: NEGATIVE
Nitrite, UA: NEGATIVE
Specific Gravity, Urine: 1.025
Urobilinogen, Ur: 0.2
pH, UA: 6 (ref 5.0–8.0)

## 2019-11-06 NOTE — Progress Notes (Signed)
Subjective: Chief Complaint  Patient presents with  . Aneurysm    ed follow up    Medical team: Krystal Houston Prior PCP, Dr. Jeanann Houston  Here for new patient establish care and fu from hospital. Was seeing  Dr. Jeanann Houston prior to him retiring.  Her friend Krystal Houston recommended her to come here.  Was hospitalized for aneurysm repair last week.    Doing ok today.  She has hx/o aneurysm, but with recent head caches, scan was done and the prior stent needed repair.  She was also diagnosed with UTI in the last week.  Finished IV Cipro in the hospital and 3 days of Cipro upon discharge.  Still has some dysuria some lower abdominal discomfort.  otherwise has been in usual state of health.     No hx/o anemia.    No current bleeding.  Has colonoscopy 5 years ago with [name].  Discharge summary info:  She was admitted 10/23/2019 through 10/24/2019 for brain aneurysm and urinary tract infection.  She was initially put on IV Cipro and then discharged on oral Cipro.  She did receive some Macrobid early on in the course.  She had image guided cerebral arteriogram with staged embolization of left ICA intracranial aneurysm by Krystal Houston.  No other aggravating or relieving factors. No other complaint.  Past Medical History:  Diagnosis Date  . Brain aneurysm   . Headache   . Hyperlipidemia   . Hypertension    Patient denies  . Leukocytosis    she reports she has had this her whole life, on labs back to 2009   Current Outpatient Medications on File Prior to Visit  Medication Sig Dispense Refill  . aspirin 325 MG tablet Take 1 tablet (325 mg total) by mouth daily. 30 tablet 3  . clopidogrel (PLAVIX) 75 MG tablet Take 75 mg by mouth daily.    Marland Kitchen lisinopril (ZESTRIL) 10 MG tablet Take 10 mg by mouth daily.    . ciprofloxacin (CIPRO) 500 MG tablet Take one tablet by mouth once daily for three days. (Patient not taking: Reported on 11/06/2019) 3 tablet 0   No  current facility-administered medications on file prior to visit.   ROS as in subjective   Objective: BP (!) 150/86   Ht 5\' 1"  (1.549 m)   Wt 145 lb (65.8 kg)   BMI 27.40 kg/m   Gen: wd, wn, nad, African American female HEENT: normocephalic, sclerae anicteric, TMs pearly, nares patent, no discharge or erythema, pharynx normal Oral cavity: MMM, no lesions Neck: supple, no lymphadenopathy, no thyromegaly, no masses Heart: RRR, normal S1, S2, no murmurs Lungs: CTA bilaterally, no wheezes, rhonchi, or rales Abdomen: +bs, soft,tender lower abdominal throughout, otherwise non tender, non distended, no masses, no hepatomegaly, no splenomegaly Pulses: 2+ symmetric, upper and lower extremities, normal cap refill Neuro: CN2-12 intact, nonfocal exam    Assessment: Encounter Diagnoses  Name Primary?  . Acute cystitis with hematuria Yes  . Brain aneurysm   . Essential hypertension   . Hyperlipidemia, unspecified hyperlipidemia type      Plan: I reviewed the hospital discharge summary, medications reconciled and reviewed findings.  Hypertension-continue lisinopril 10 mg daily  History of aneurysm and recent embolization-continue Plavix and aspirin 325 mg daily  Urinary tract infection-still has symptoms so we will change to Macrobid since she does not seem to be responding or eyes new or worsening symptoms.  Call back if not improving the next week.  Otherwise return for a  physical in the near future fasting  [name] was seen today for aneurysm.  Diagnoses and all orders for this visit:  Acute cystitis with hematuria -     POCT Urinalysis DIP (Proadvantage Device)  Brain aneurysm  Essential hypertension  Hyperlipidemia, unspecified hyperlipidemia type  Other orders -     nitrofurantoin, macrocrystal-monohydrate, (MACROBID) 100 MG capsule; Take 1 capsule (100 mg total) by mouth 2 (two) times daily.   F/u with interventional radiology today s/p recent aneurysm procedure.

## 2019-11-07 ENCOUNTER — Other Ambulatory Visit: Payer: Self-pay | Admitting: Medical

## 2019-11-07 ENCOUNTER — Telehealth: Payer: Self-pay | Admitting: Medical

## 2019-11-07 DIAGNOSIS — N3001 Acute cystitis with hematuria: Secondary | ICD-10-CM | POA: Insufficient documentation

## 2019-11-07 DIAGNOSIS — E785 Hyperlipidemia, unspecified: Secondary | ICD-10-CM | POA: Insufficient documentation

## 2019-11-07 MED ORDER — NITROFURANTOIN MONOHYD MACRO 100 MG PO CAPS: 100.0000 mg | ORAL_CAPSULE | Freq: Two times a day (BID) | ORAL | 0 refills | Status: DC

## 2019-11-07 NOTE — Telephone Encounter (Signed)
Please call pt, there is some confusion on if you are sending in antibiotic or not. Per patient Krystal Houston told her that you were sending in rx for her to [facility] and per your note that I read to her it does not look like you are and I do not see rx sent in.  Pt wanted to let you know that she is urinating more than normal and she is still sore and tender in that place where she was tender yesterday when you examined her

## 2019-11-07 NOTE — Telephone Encounter (Signed)
I sent Macrobid to pharmacy, twice daily for 5-7 days since she still has symptoms.   I changed to this since the cipro didn't seem to completely clear the symptoms

## 2019-11-07 NOTE — Telephone Encounter (Signed)
Called pt and informed

## 2019-11-08 DIAGNOSIS — Z23 Encounter for immunization: Secondary | ICD-10-CM | POA: Diagnosis not present

## 2019-11-10 HISTORY — PX: IR RADIOLOGIST EVAL & MGMT: IMG5224

## 2019-11-19 ENCOUNTER — Ambulatory Visit: Payer: BC Managed Care – PPO | Admitting: Medical

## 2019-11-24 ENCOUNTER — Ambulatory Visit
Admission: EM | Admit: 2019-11-24 | Discharge: 2019-11-24 | Disposition: A | Discharge status: Home / Self Care | Payer: BC Managed Care – PPO | Attending: Emergency Medicine | Admitting: Emergency Medicine

## 2019-11-24 ENCOUNTER — Other Ambulatory Visit: Payer: Self-pay

## 2019-11-24 DIAGNOSIS — R109 Unspecified abdominal pain: Secondary | ICD-10-CM | POA: Diagnosis not present

## 2019-11-24 DIAGNOSIS — R300 Dysuria: Secondary | ICD-10-CM | POA: Diagnosis not present

## 2019-11-24 LAB — POCT URINALYSIS DIP (MANUAL ENTRY)
Glucose, UA: NEGATIVE mg/dL
Ketones, POC UA: NEGATIVE mg/dL
Leukocytes, UA: NEGATIVE
Nitrite, UA: NEGATIVE
Protein Ur, POC: NEGATIVE mg/dL
Spec Grav, UA: >=1.03 — AB (ref 1.010–1.025)
Urobilinogen, UA: 0.2 E.U./dL
pH, UA: 5.5 (ref 5.0–8.0)

## 2019-11-24 MED ORDER — PHENAZOPYRIDINE HCL 200 MG PO TABS: 200.0000 mg | ORAL_TABLET | Freq: Three times a day (TID) | ORAL | 0 refills | Status: DC

## 2019-11-24 NOTE — ED Provider Notes (Signed)
[facility]   CC: Burning with urination  SUBJECTIVE:  Krystal Houston is a 64 y.o. female who complains of lower abdominal pressure, and urinary discomfort 3-4 weeks.  Recently hospitalized.  States symptoms began while she was in the hospital.  Reports lower abdominal pressure.  Was treated with cipro and macrobid with minimal relief.  Admits to similar symptoms in the past with UTI.  Denies fever, chills, nausea, vomiting, flank pain, abnormal vaginal discharge or bleeding, hematuria.    LMP: No LMP recorded. Patient has had a hysterectomy.  ROS: As in HPI.  All other pertinent ROS negative.     Past Medical History:  Diagnosis Date   Brain aneurysm    Headache    Hyperlipidemia    Hypertension    Patient denies   Leukocytosis    she reports she has had this her whole life, on labs back to 2009   Past Surgical History:  Procedure Laterality Date   ABDOMINAL HYSTERECTOMY     CEREBRAL ANEURYSM REPAIR     IR 3D INDEPENDENT WKST  08/19/2019   IR ANGIO INTRA EXTRACRAN SEL COM CAROTID INNOMINATE BILAT MOD SED  08/19/2019   IR ANGIO INTRA EXTRACRAN SEL INTERNAL CAROTID UNI L MOD SED  10/23/2019   IR ANGIO VERTEBRAL SEL VERTEBRAL BILAT MOD SED  08/19/2019   IR ANGIOGRAM FOLLOW UP STUDY  10/23/2019   IR CT HEAD LTD  10/23/2019   IR RADIOLOGIST EVAL & MGMT  11/10/2019   IR TRANSCATH/EMBOLIZ  10/23/2019   IR US GUIDE VASC ACCESS RIGHT  08/19/2019   IR US GUIDE VASC ACCESS RIGHT  10/23/2019   RADIOLOGY WITH ANESTHESIA N/A 10/23/2019   Procedure: RADIOLOGY WITH ANESTHESIA  EMBOLIZATION;  Surgeon: [name], MD;  Location: [facility];  Service: Radiology;  Laterality: N/A;   vaginal deliveries     x 3   Allergies  Allergen Reactions   Sulfamethoxazole Hives, Rash and Other (See Comments)    REACTION: vomiting   No current facility-administered medications on file prior to encounter.   Current Outpatient Medications on File Prior to  Encounter  Medication Sig Dispense Refill   aspirin 325 MG tablet Take 1 tablet (325 mg total) by mouth daily. 30 tablet 3   clopidogrel (PLAVIX) 75 MG tablet Take 75 mg by mouth daily.     lisinopril (ZESTRIL) 10 MG tablet Take 10 mg by mouth daily.     nitrofurantoin, macrocrystal-monohydrate, (MACROBID) 100 MG capsule Take 1 capsule (100 mg total) by mouth 2 (two) times daily. 14 capsule 0   Social History   Socioeconomic History   Marital status: Married    Spouse name: Not on file   Number of children: Not on file   Years of education: Not on file   Highest education level: Not on file  Occupational History   Not on file  Tobacco Use   Smoking status: Never Smoker   Smokeless tobacco: Never Used  Vaping Use   Vaping Use: Never used  Substance and Sexual Activity   Alcohol use: No   Drug use: No   Sexual activity: Not Currently    Birth control/protection: Surgical    Comment: Hysterectomy  Other Topics Concern   Not on file  Social History Narrative   Not on file   Social Determinants of Health   Financial Resource Strain:    Difficulty of Paying Living Expenses: Not on file  Food Insecurity:    Worried About Running Out of Food  in the Last Year: Not on file   Ran Out of Food in the Last Year: Not on file  Transportation Needs:    Lack of Transportation (Medical): Not on file   Lack of Transportation (Non-Medical): Not on file  Physical Activity:    Days of Exercise per Week: Not on file   Minutes of Exercise per Session: Not on file  Stress:    Feeling of Stress : Not on file  Social Connections:    Frequency of Communication with Friends and Family: Not on file   Frequency of Social Gatherings with Friends and Family: Not on file   Attends Religious Services: Not on file   Active Member of Clubs or Organizations: Not on file   Attends Archivist Meetings: Not on file   Marital Status: Not on file  Intimate Partner  Violence:    Fear of Current or Ex-Partner: Not on file   Emotionally Abused: Not on file   Physically Abused: Not on file   Sexually Abused: Not on file   Family History  Problem Relation Age of Onset   Heart disease Mother    Diabetes Father    Coronary artery disease Other    Diabetes Other    Colon cancer Neg Hx    Esophageal cancer Neg Hx    Rectal cancer Neg Hx    Stomach cancer Neg Hx     OBJECTIVE:  Vitals:   11/24/19 1017  BP: 131/86  Pulse: 76  Resp: 20  Temp: 98.1 F (36.7 C)  SpO2: 98%   General appearance: Alert in no acute distress HEENT: NCAT.  Oropharynx clear.  Lungs: clear to auscultation bilaterally without adventitious breath sounds Heart: regular rate and rhythm.   Abdomen: soft; non-distended; no tenderness; bowel sounds present; no guarding  Back: no CVA tenderness Extremities: no edema; symmetrical with no gross deformities Skin: warm and dry Neurologic: Ambulates from chair to exam table without difficulty Psychological: alert and cooperative; normal mood and affect  Labs Reviewed  POCT URINALYSIS DIP (MANUAL ENTRY) - Abnormal; Notable for the following components:      Result Value   Bilirubin, UA small (*)    Spec Grav, UA >=1.030 (*)    Blood, UA small (*)    All other components within normal limits  URINE CULTURE    ASSESSMENT & PLAN:  1. Dysuria   2. Abdominal pressure     Meds ordered this encounter  Medications   phenazopyridine (PYRIDIUM) 200 MG tablet    Sig: Take 1 tablet (200 mg total) by mouth 3 (three) times daily.    Dispense:  6 tablet    Refill:  0    Order Specific Question:   Supervising Provider    Answer:   Raylene Everts [[id number]]   Urine not concerning for UTI Urine culture sent.  We will call you with the results.   Push fluids and get plenty of rest.   Take pyridium as prescribed and as needed for symptomatic relief Follow up with PCP for recheck Return here or go to ER if you have  any new or worsening symptoms such as fever, worsening abdominal pain, nausea/vomiting, flank pain, etc...  Outlined signs and symptoms indicating need for more acute intervention. Patient verbalized understanding. After Visit Summary given.     Lestine Box, PA-C 11/24/19 1043

## 2019-11-24 NOTE — Discharge Instructions (Signed)
Urine concerning not concerning for UTI Urine culture sent.  We will call you with the results.   Push fluids and get plenty of rest.   Take pyridium as prescribed and as needed for symptomatic relief Follow up with PCP if symptoms persists Return here or go to ER if you have any new or worsening symptoms such as fever, worsening abdominal pain, nausea/vomiting, flank pain, etc..Marland Kitchen

## 2019-11-24 NOTE — ED Triage Notes (Signed)
Pt presents with lower abdominal pain and urinary discomfort that doesn't feel as has completley resolved from last uti

## 2019-11-25 LAB — URINE CULTURE

## 2019-12-09 ENCOUNTER — Other Ambulatory Visit: Payer: Self-pay | Admitting: Medical

## 2019-12-09 ENCOUNTER — Telehealth: Payer: Self-pay | Admitting: Medical

## 2019-12-09 MED ORDER — LISINOPRIL 10 MG PO TABS: 10.0000 mg | ORAL_TABLET | Freq: Every day | ORAL | 1 refills | Status: DC

## 2019-12-09 NOTE — Telephone Encounter (Signed)
Pt called for refills of lisinopril. We have never filled this for her but is on med list. [facility] [place]. Pt can be reached at [phone].

## 2019-12-09 NOTE — Telephone Encounter (Signed)
Refill sent.

## 2020-01-01 ENCOUNTER — Other Ambulatory Visit: Payer: Self-pay

## 2020-01-01 ENCOUNTER — Ambulatory Visit (INDEPENDENT_AMBULATORY_CARE_PROVIDER_SITE_OTHER): Payer: BC Managed Care – PPO | Admitting: Family Medicine

## 2020-01-01 ENCOUNTER — Encounter: Payer: Self-pay | Admitting: Family Medicine

## 2020-01-01 VITALS — BP 130/80 | HR 64 | Temp 98.0°F | Wt 143.8 lb

## 2020-01-01 DIAGNOSIS — M7582 Other shoulder lesions, left shoulder: Secondary | ICD-10-CM | POA: Diagnosis not present

## 2020-01-01 DIAGNOSIS — M7522 Bicipital tendinitis, left shoulder: Secondary | ICD-10-CM | POA: Diagnosis not present

## 2020-01-01 NOTE — Patient Instructions (Signed)
Take 2 Aleve twice per day.  Repeat to your shoulder couple times per day you can also use 2 Tylenol 4 times per day

## 2020-01-01 NOTE — Progress Notes (Signed)
   Subjective:    Patient ID: Calla Kicks, female    DOB: [birth year], 64 y.o.   MRN: [id number]  HPI She states that 6 weeks ago she was helping her aunt get up from the commode with her arms forward trying to lift her and experienced some left shoulder pain mainly the next day.  Is been sore since then with abduction and external rotation as well as internal rotation.  She now has a new job which requires use of her arm a lot and is having difficulty with that.   Review of Systems     Objective:   Physical Exam Pain on motion of the shoulder.  Tender to palpation over the bicipital groove with provocative testing being positive.  Sulcus sign appears negative but she did splint.  Abduction and external rotation was quite painful.  Could not really do Neer's and Hawkins test with her.       Assessment & Plan:  Biceps tendinitis of left upper extremity - Plan: DG Shoulder Left, Ambulatory referral to Orthopedic Surgery  Tendinitis of left rotator cuff - Plan: DG Shoulder Left, Ambulatory referral to Orthopedic Surgery Take 2 Aleve twice per day.  Repeat to your shoulder couple times per day you can also use 2 Tylenol 4 times per day I will treat conservatively but refer to orthopedics to Dr. Prince Rome to get an ultrasound and more definitive care for the bicipital tendinitis and possible rotator cuff tendinitis.  I anticipate rehab for her as well.

## 2020-01-03 HISTORY — PX: CEREBRAL ANEURYSM REPAIR: SHX164

## 2020-01-05 ENCOUNTER — Ambulatory Visit: Payer: BC Managed Care – PPO | Admitting: Family Medicine

## 2020-01-07 ENCOUNTER — Other Ambulatory Visit: Payer: Self-pay

## 2020-01-07 ENCOUNTER — Ambulatory Visit (INDEPENDENT_AMBULATORY_CARE_PROVIDER_SITE_OTHER): Payer: BC Managed Care – PPO | Admitting: Family Medicine

## 2020-01-07 ENCOUNTER — Ambulatory Visit: Payer: Self-pay

## 2020-01-07 DIAGNOSIS — M25512 Pain in left shoulder: Secondary | ICD-10-CM | POA: Diagnosis not present

## 2020-01-07 MED ORDER — NABUMETONE 500 MG PO TABS: 500.0000 mg | ORAL_TABLET | Freq: Two times a day (BID) | ORAL | 3 refills | Status: DC | PRN

## 2020-01-07 MED ORDER — DICLOFENAC SODIUM 1 % EX GEL: 4.0000 g | Freq: Four times a day (QID) | CUTANEOUS | 6 refills | Status: DC | PRN

## 2020-01-07 NOTE — Progress Notes (Signed)
Office Visit Note   Patient: Krystal Houston           Date of Birth: [birth year]           MRN: [id number] Visit Date: 01/07/2020 Requested by: Ronnald Nian, MD [address] [place],  [place] [address] PCP: Jac Canavan, PA-C  Subjective: Chief Complaint  Patient presents with  . 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 She tried to lift her at the end of November, this year. Had pain in the shoulder afterward. Decreased ROM. Cannot sleep well due to pain. Pain in shoulder down to the elbow.     HPI: She is here with left shoulder pain.  In November she was taking care of her 64 year old aunt.  She was trying to keep her from falling, and reached out to grab her and help her sit down on the commode.  She did not feel any pain at the time, but 2 days later she started feeling progressive soreness in her shoulder first in the anterior shoulder and then on top.  Now it hurts all the time and she has decreased range of motion.  She is right-hand dominant but her work involves repetitive use of her left shoulder so this is affecting her ability to work.  She tried Tylenol with no improvement.  No previous problems with her shoulder.  No history of diabetes.  She has hypertension which is well controlled.                ROS:   All other systems were reviewed and are negative.  Objective: Vital Signs: There were no vitals taken for this visit.  Physical Exam:  General:  Alert and oriented, in no acute distress. Pulm:  Breathing unlabored. Psy:  Normal mood, congruent affect.  Left shoulder: She has adhesive capsulitis with abduction just about 75 degrees, internal rotation about 45 degrees and external rotation about 45 degrees.  She is moderately tender at the [facility] joint and in the posterior subacromial space.  Mild tenderness over the long head biceps tendon.  Speeds test is negative, no pain with supination of the forearm against resistance.  Isometric  rotator cuff strength is still 5/5 throughout, negative drop test.   Imaging: XR Shoulder Left  Result Date: 01/07/2020 X-rays of the left shoulder reveal normal anatomic alignment, no subluxation.  Very mild glenohumeral degenerative change and mild to moderate AC joint degenerative change.  No soft tissue calcifications.   Assessment & Plan: 1.  Left shoulder pain with adhesive capsulitis and probable impingement, cannot rule out partial rotator cuff tear. -We discussed options, she would like to avoid surgery if possible.  We elected to inject the subacromial space and I referred her to PT and hand in [place].  Trial of Relafen by mouth as needed, Voltaren gel topically. -If pain persists, we will image with ultrasound and if no rotator cuff tear, we will do a glenohumeral injection at that point.     Procedures: No procedures performed        PMFS History: Patient Active Problem List   Diagnosis Date Noted  . Acute cystitis with hematuria 11/07/2019  . Hyperlipidemia 11/07/2019  . Brain aneurysm 10/23/2019  . HYPERLIPIDEMIA 05/22/2007  . Essential hypertension 05/22/2007  . CEREBRAL ANEURYSM 05/22/2007   Past Medical History:  Diagnosis Date  . Brain aneurysm   . Headache   . Hyperlipidemia   . Hypertension    Patient denies  .  Leukocytosis    she reports she has had this her whole life, on labs back to 2009    Family History  Problem Relation Age of Onset  . Heart disease Mother   . Diabetes Father   . Coronary artery disease Other   . Diabetes Other   . Colon cancer Neg Hx   . Esophageal cancer Neg Hx   . Rectal cancer Neg Hx   . Stomach cancer Neg Hx     Past Surgical History:  Procedure Laterality Date  . ABDOMINAL HYSTERECTOMY    . CEREBRAL ANEURYSM REPAIR    . IR 3D INDEPENDENT WKST  08/19/2019  . IR ANGIO INTRA EXTRACRAN SEL COM CAROTID INNOMINATE BILAT MOD SED  08/19/2019  . IR ANGIO INTRA EXTRACRAN SEL INTERNAL CAROTID UNI L MOD SED  10/23/2019   . IR ANGIO VERTEBRAL SEL VERTEBRAL BILAT MOD SED  08/19/2019  . IR ANGIOGRAM FOLLOW UP STUDY  10/23/2019  . IR CT HEAD LTD  10/23/2019  . IR RADIOLOGIST EVAL & MGMT  11/10/2019  . IR TRANSCATH/EMBOLIZ  10/23/2019  . IR US GUIDE VASC ACCESS RIGHT  08/19/2019  . IR US GUIDE VASC ACCESS RIGHT  10/23/2019  . RADIOLOGY WITH ANESTHESIA N/A 10/23/2019   Procedure: RADIOLOGY WITH ANESTHESIA  EMBOLIZATION;  Surgeon: [name], MD;  Location: [facility];  Service: Radiology;  Laterality: N/A;  . vaginal deliveries     x 3   Social History   Occupational History  . Not on file  Tobacco Use  . Smoking status: Never Smoker  . Smokeless tobacco: Never Used  Vaping Use  . Vaping Use: Never used  Substance and Sexual Activity  . Alcohol use: No  . Drug use: No  . Sexual activity: Not Currently    Birth control/protection: Surgical    Comment: Hysterectomy

## 2020-01-15 ENCOUNTER — Telehealth: Payer: Self-pay | Admitting: Family Medicine

## 2020-01-15 ENCOUNTER — Telehealth: Payer: Self-pay

## 2020-01-15 ENCOUNTER — Other Ambulatory Visit: Payer: Self-pay

## 2020-01-15 DIAGNOSIS — R531 Weakness: Secondary | ICD-10-CM | POA: Diagnosis not present

## 2020-01-15 DIAGNOSIS — M7502 Adhesive capsulitis of left shoulder: Secondary | ICD-10-CM | POA: Diagnosis not present

## 2020-01-15 DIAGNOSIS — M25512 Pain in left shoulder: Secondary | ICD-10-CM | POA: Diagnosis not present

## 2020-01-15 DIAGNOSIS — M25612 Stiffness of left shoulder, not elsewhere classified: Secondary | ICD-10-CM | POA: Diagnosis not present

## 2020-01-15 MED ORDER — TRAMADOL HCL 50 MG PO TABS
50.0000 mg | ORAL_TABLET | Freq: Three times a day (TID) | ORAL | 0 refills | Status: DC | PRN
Start: 2020-01-15 — End: 2020-09-23

## 2020-01-15 NOTE — Telephone Encounter (Signed)
Was given nabumetone and diclofenac gel. Please advise.

## 2020-01-15 NOTE — Telephone Encounter (Signed)
Rx sent 

## 2020-01-15 NOTE — Telephone Encounter (Signed)
I just put that order in (the patient was given the hard copy).   Would you please fax the Rx and demographics?

## 2020-01-15 NOTE — Telephone Encounter (Signed)
Patient called. She would like an increase in her pain medication. Says it isn't working. She is in pain. Her call back number is [phone]

## 2020-01-15 NOTE — Telephone Encounter (Signed)
I called and reached her voice mail -- advised her to check the pharmacy for the new medication.

## 2020-01-15 NOTE — Telephone Encounter (Signed)
Beth from [facility] called she is requesting patients insurance info and and rx to be faxed to their office. SEL:[phone] [phone]

## 2020-01-15 NOTE — Telephone Encounter (Signed)
Referral faxed

## 2020-01-16 ENCOUNTER — Ambulatory Visit: Payer: BC Managed Care – PPO | Admitting: Family Medicine

## 2020-01-30 ENCOUNTER — Telehealth: Payer: Self-pay | Admitting: Family Medicine

## 2020-01-30 ENCOUNTER — Telehealth: Payer: Self-pay

## 2020-01-30 DIAGNOSIS — M25512 Pain in left shoulder: Secondary | ICD-10-CM

## 2020-01-30 MED ORDER — HYDROCODONE-ACETAMINOPHEN 5-325 MG PO TABS
1.0000 | ORAL_TABLET | Freq: Two times a day (BID) | ORAL | 0 refills | Status: DC | PRN
Start: 2020-01-30 — End: 2020-02-27

## 2020-01-30 NOTE — Addendum Note (Signed)
Addended by: Hortencia Pilar on: 01/30/2020 02:50 PM   Modules accepted: Orders

## 2020-01-30 NOTE — Telephone Encounter (Signed)
Patient called she is requesting a different and stronger medication she stated the tramadol isn't touching the pain. CB:[phone]

## 2020-01-30 NOTE — Telephone Encounter (Signed)
I called and reached voice mail - left message to check with the pharmacy for the new Rx and MRI has been ordered. The imaging facility will be in contact with her to schedule an appointment, provided her insurance approves it.

## 2020-01-30 NOTE — Telephone Encounter (Signed)
Pt advised.

## 2020-01-30 NOTE — Telephone Encounter (Signed)
She is seeing Dr. Junius Roads for this and therefore needs to follow-up with him

## 2020-01-30 NOTE — Telephone Encounter (Signed)
Pt called and said she is still having shoulder pain and the Tramadol isn't working. 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 [facility] [place]

## 2020-01-30 NOTE — Telephone Encounter (Signed)
Please advise. She contacted Dr. Lanice Shirts office earlier today. He responded she would need to call here.

## 2020-02-14 ENCOUNTER — Other Ambulatory Visit: Payer: BC Managed Care – PPO

## 2020-02-17 ENCOUNTER — Telehealth (HOSPITAL_COMMUNITY): Payer: Self-pay

## 2020-02-17 NOTE — Telephone Encounter (Signed)
Called to schedule mri/mra, no answer, left vm. AW  

## 2020-02-18 ENCOUNTER — Other Ambulatory Visit (HOSPITAL_COMMUNITY): Payer: Self-pay | Admitting: Interventional Radiology

## 2020-02-18 DIAGNOSIS — I671 Cerebral aneurysm, nonruptured: Secondary | ICD-10-CM

## 2020-02-25 ENCOUNTER — Other Ambulatory Visit: Payer: Self-pay

## 2020-02-25 ENCOUNTER — Ambulatory Visit
Admission: RE | Admit: 2020-02-25 | Discharge: 2020-02-25 | Disposition: A | Discharge status: Home / Self Care | Payer: BC Managed Care – PPO | Source: Ambulatory Visit | Attending: Family Medicine | Admitting: Family Medicine

## 2020-02-25 DIAGNOSIS — M25512 Pain in left shoulder: Secondary | ICD-10-CM

## 2020-02-25 DIAGNOSIS — M67812 Other specified disorders of synovium, left shoulder: Secondary | ICD-10-CM | POA: Diagnosis not present

## 2020-02-25 DIAGNOSIS — M19012 Primary osteoarthritis, left shoulder: Secondary | ICD-10-CM | POA: Diagnosis not present

## 2020-02-27 ENCOUNTER — Telehealth: Payer: Self-pay | Admitting: Family Medicine

## 2020-02-27 ENCOUNTER — Ambulatory Visit (INDEPENDENT_AMBULATORY_CARE_PROVIDER_SITE_OTHER): Payer: BC Managed Care – PPO | Admitting: Family Medicine

## 2020-02-27 ENCOUNTER — Telehealth: Payer: Self-pay

## 2020-02-27 ENCOUNTER — Ambulatory Visit: Payer: Self-pay

## 2020-02-27 ENCOUNTER — Other Ambulatory Visit: Payer: Self-pay

## 2020-02-27 DIAGNOSIS — M25512 Pain in left shoulder: Secondary | ICD-10-CM | POA: Diagnosis not present

## 2020-02-27 MED ORDER — HYDROCODONE-ACETAMINOPHEN 5-325 MG PO TABS: 1.0000 | ORAL_TABLET | Freq: Two times a day (BID) | ORAL | 0 refills | Status: DC | PRN

## 2020-02-27 NOTE — Telephone Encounter (Signed)
Hilts pt 

## 2020-02-27 NOTE — Progress Notes (Signed)
Office Visit Note   Patient: Krystal Houston           Date of Birth: [birth year]           MRN: [id number] Visit Date: 02/27/2020 Requested by: Carlena Hurl, PA-C [address] [place],  [place] [address] PCP: Carlena Hurl, PA-C  Subjective: Chief Complaint  Patient presents with  . Left Shoulder - Pain, Follow-up    Planned GH cortisone injection    HPI: She is here with persistent left shoulder pain.  She was not making progress with physical therapy.  Still having severe pain, especially at night.  MRI scan showed partial tearing of the rotator cuff with changes of adhesive capsulitis.              ROS:   All other systems were reviewed and are negative.  Objective: Vital Signs: There were no vitals taken for this visit.  Physical Exam:  General:  Alert and oriented, in no acute distress. Pulm:  Breathing unlabored. Psy:  Normal mood, congruent affect.  Left shoulder: She has significant frozen shoulder.  She has pain at the extremes.  Imaging: US Guided Needle Placement - No Linked Charges  Result Date: 02/27/2020  Ultrasound guided injection is preferred based studies that show increased duration, increased effect, greater accuracy, decreased procedural pain, increased response rate, and decreased cost with ultrasound guided versus blind injection.   Verbal informed consent obtained.  Time-out conducted.  Noted no overlying erythema, induration, or other signs of local infection. Ultrasound-guided left glenohumeral injection: After sterile prep with Betadine, injected 4 cc 0.25% bupivocaine without epinephrine and 6 mg betamethasone using a 22-gauge spinal needle, passing the needle from posterior approach into the glenohumeral joint.  Injectate seen filling joint capsule.     Assessment & Plan: 1.  Left shoulder adhesive capsulitis with partial rotator cuff tear -Glenohumeral injection given today.  Physical therapy.  Surgical consult if she fails to  improve.  Hydrocodone to use sparingly.     Procedures: No procedures performed        PMFS History: Patient Active Problem List   Diagnosis Date Noted  . Acute cystitis with hematuria 11/07/2019  . Hyperlipidemia 11/07/2019  . Brain aneurysm 10/23/2019  . HYPERLIPIDEMIA 05/22/2007  . Essential hypertension 05/22/2007  . CEREBRAL ANEURYSM 05/22/2007   Past Medical History:  Diagnosis Date  . Brain aneurysm   . Headache   . Hyperlipidemia   . Hypertension    Patient denies  . Leukocytosis    she reports she has had this her whole life, on labs back to 2009    Family History  Problem Relation Age of Onset  . Heart disease Mother   . Diabetes Father   . Coronary artery disease Other   . Diabetes Other   . Colon cancer Neg Hx   . Esophageal cancer Neg Hx   . Rectal cancer Neg Hx   . Stomach cancer Neg Hx     Past Surgical History:  Procedure Laterality Date  . ABDOMINAL HYSTERECTOMY    . CEREBRAL ANEURYSM REPAIR    . IR 3D INDEPENDENT WKST  08/19/2019  . IR ANGIO INTRA EXTRACRAN SEL COM CAROTID INNOMINATE BILAT MOD SED  08/19/2019  . IR ANGIO INTRA EXTRACRAN SEL INTERNAL CAROTID UNI L MOD SED  10/23/2019  . IR ANGIO VERTEBRAL SEL VERTEBRAL BILAT MOD SED  08/19/2019  . IR ANGIOGRAM FOLLOW UP STUDY  10/23/2019  . IR CT HEAD LTD  10/23/2019  .  IR RADIOLOGIST EVAL & MGMT  11/10/2019  . IR TRANSCATH/EMBOLIZ  10/23/2019  . IR US GUIDE VASC ACCESS RIGHT  08/19/2019  . IR US GUIDE VASC ACCESS RIGHT  10/23/2019  . RADIOLOGY WITH ANESTHESIA N/A 10/23/2019   Procedure: RADIOLOGY WITH ANESTHESIA  EMBOLIZATION;  Surgeon: [name], MD;  Location: [facility];  Service: Radiology;  Laterality: N/A;  . vaginal deliveries     x 3   Social History   Occupational History  . Not on file  Tobacco Use  . Smoking status: Never Smoker  . Smokeless tobacco: Never Used  Vaping Use  . Vaping Use: Never used  Substance and Sexual Activity  . Alcohol use: No  . Drug use: No  .  Sexual activity: Not Currently    Birth control/protection: Surgical    Comment: Hysterectomy

## 2020-02-27 NOTE — Telephone Encounter (Signed)
I called and advised the patient of the results. Will come in this afternoon at 4:20 for glenohumeral injection.

## 2020-02-27 NOTE — Telephone Encounter (Signed)
duplicate

## 2020-02-27 NOTE — Telephone Encounter (Signed)
Patient called she would like a call back regarding mri results call [phone]

## 2020-02-27 NOTE — Telephone Encounter (Signed)
Shoulder MRI scan shows changes of frozen shoulder, as well as a small partial tear of the rotator cuff but not a complete tear.  There is a very good chance that this will eventually heal without surgery.  Presuming pain is no severe, could consider a glenohumeral injection followed by resumption of physical therapy.

## 2020-03-01 ENCOUNTER — Telehealth: Payer: Self-pay | Admitting: Family Medicine

## 2020-03-01 NOTE — Telephone Encounter (Signed)
Krystal Houston with [facility] called stating Dr. Junius Roads put in a referral for PT for the pts shoulder but [facility] can only do OT for shoulders. Krystal Houston would like Dr. Junius Roads to change the order and fax it back in   Fax# [phone]

## 2020-03-01 NOTE — Telephone Encounter (Signed)
Order has been changed from PT to OT

## 2020-03-03 ENCOUNTER — Ambulatory Visit (HOSPITAL_COMMUNITY)
Admission: RE | Admit: 2020-03-03 | Discharge: 2020-03-03 | Disposition: A | Discharge status: Home / Self Care | Payer: BC Managed Care – PPO | Source: Ambulatory Visit | Attending: Interventional Radiology | Admitting: Interventional Radiology

## 2020-03-03 ENCOUNTER — Other Ambulatory Visit: Payer: Self-pay

## 2020-03-03 DIAGNOSIS — I671 Cerebral aneurysm, nonruptured: Secondary | ICD-10-CM | POA: Diagnosis not present

## 2020-03-03 MED ORDER — GADOBUTROL 1 MMOL/ML IV SOLN
6.5000 mL | Freq: Once | INTRAVENOUS | Status: AC | PRN
  Administered 2020-03-03: 6.5 mL via INTRAVENOUS

## 2020-03-08 ENCOUNTER — Telehealth (HOSPITAL_COMMUNITY): Payer: Self-pay

## 2020-03-08 NOTE — Telephone Encounter (Signed)
Pt agreed to f/u in 6 months with mra. AW  

## 2020-03-19 ENCOUNTER — Ambulatory Visit (HOSPITAL_COMMUNITY): Payer: BC Managed Care – PPO | Admitting: Occupational Therapy

## 2020-03-29 ENCOUNTER — Telehealth: Payer: Self-pay

## 2020-03-29 MED ORDER — HYDROCODONE-ACETAMINOPHEN 5-325 MG PO TABS: 1.0000 | ORAL_TABLET | Freq: Two times a day (BID) | ORAL | 0 refills | Status: DC | PRN

## 2020-03-29 NOTE — Telephone Encounter (Signed)
Pt called and would like a refill on the hydrocodone.  Pt would also like to know if she can get a Inj in her shoulder ?  Pt can't start PT until April 5th

## 2020-03-29 NOTE — Telephone Encounter (Signed)
Pt was last seen 2/25 for an inj

## 2020-03-29 NOTE — Telephone Encounter (Signed)
Please advise on both

## 2020-03-29 NOTE — Telephone Encounter (Signed)
Yes to both.

## 2020-03-29 NOTE — Addendum Note (Signed)
Addended by: Hortencia Pilar on: 03/29/2020 09:49 AM   Modules accepted: Orders

## 2020-03-29 NOTE — Telephone Encounter (Signed)
I called and advised the patient the medication has been sent in to her pharmacy. Scheduled her an appt with Dr. Junius Roads for 3/31 at 8:20 for a repeat injection.

## 2020-04-01 ENCOUNTER — Ambulatory Visit (INDEPENDENT_AMBULATORY_CARE_PROVIDER_SITE_OTHER): Payer: BC Managed Care – PPO | Admitting: Family Medicine

## 2020-04-01 ENCOUNTER — Ambulatory Visit: Payer: Self-pay

## 2020-04-01 ENCOUNTER — Encounter: Payer: Self-pay | Admitting: Family Medicine

## 2020-04-01 ENCOUNTER — Other Ambulatory Visit: Payer: Self-pay

## 2020-04-01 DIAGNOSIS — M25512 Pain in left shoulder: Secondary | ICD-10-CM

## 2020-04-01 NOTE — Progress Notes (Signed)
Office Visit Note   Patient: Krystal Houston           Date of Birth: 11/25/1955           MRN: [id number] Visit Date: 04/01/2020 Requested by: Carlena Hurl, PA-C [address] [place],  [place] [address] PCP: Carlena Hurl, PA-C  Subjective: Chief Complaint  Patient presents with  . Left Shoulder - Pain    Requests repeat cortisone injection [facility])    HPI: She is here with ongoing left shoulder pain.  MRI showed partial rotator cuff tear.  She has adhesive capsulitis as well.  She is getting ready to start physical therapy, having quite a bit of pain affecting her quality of life.  She wonders whether surgery might benefit her.  She would like to try another injection.  Subacromial injection did not give much relief.                ROS:   All other systems were reviewed and are negative.  Objective: Vital Signs: There were no vitals taken for this visit.  Physical Exam:  General:  Alert and oriented, in no acute distress. Pulm:  Breathing unlabored. Psy:  Normal mood, congruent affect.  Left shoulder: Very limited active and passive range of motion.    Imaging: US Guided Needle Placement - No Linked Charges  Result Date: 04/01/2020 Ultrasound guided injection is preferred based studies that show increased duration, increased effect, greater accuracy, decreased procedural pain, increased response rate, and decreased cost with ultrasound guided versus blind injection.   Verbal informed consent obtained.  Time-out conducted.  Noted no overlying erythema, induration, or other signs of local infection. Ultrasound-guided left glenohumeral injection: After sterile prep with Betadine, injected 4 cc 0.25% bupivocaine without epinephrine and 6 mg betamethasone using a 22-gauge spinal needle, passing the needle from posterior approach into the glenohumeral joint.  Injectate seen filling joint capsule.     Assessment & Plan: 1.  Left shoulder adhesive capsulitis with  partial rotator cuff tear -Glenohumeral injection given today as above.  She will start physical therapy.  If she does not make any progress soon, we will have her consult with one of our surgeons.     Procedures: No procedures performed        PMFS History: Patient Active Problem List   Diagnosis Date Noted  . Acute cystitis with hematuria 11/07/2019  . Hyperlipidemia 11/07/2019  . Brain aneurysm 10/23/2019  . HYPERLIPIDEMIA 05/22/2007  . Essential hypertension 05/22/2007  . CEREBRAL ANEURYSM 05/22/2007   Past Medical History:  Diagnosis Date  . Brain aneurysm   . Headache   . Hyperlipidemia   . Hypertension    Patient denies  . Leukocytosis    she reports she has had this her whole life, on labs back to 2009    Family History  Problem Relation Age of Onset  . Heart disease Mother   . Diabetes Father   . Coronary artery disease Other   . Diabetes Other   . Colon cancer Neg Hx   . Esophageal cancer Neg Hx   . Rectal cancer Neg Hx   . Stomach cancer Neg Hx     Past Surgical History:  Procedure Laterality Date  . ABDOMINAL HYSTERECTOMY    . CEREBRAL ANEURYSM REPAIR    . IR 3D INDEPENDENT WKST  08/19/2019  . IR ANGIO INTRA EXTRACRAN SEL COM CAROTID INNOMINATE BILAT MOD SED  08/19/2019  . IR ANGIO INTRA EXTRACRAN SEL INTERNAL  CAROTID UNI L MOD SED  10/23/2019  . IR ANGIO VERTEBRAL SEL VERTEBRAL BILAT MOD SED  08/19/2019  . IR ANGIOGRAM FOLLOW UP STUDY  10/23/2019  . IR CT HEAD LTD  10/23/2019  . IR RADIOLOGIST EVAL & MGMT  11/10/2019  . IR TRANSCATH/EMBOLIZ  10/23/2019  . IR US GUIDE VASC ACCESS RIGHT  08/19/2019  . IR US GUIDE VASC ACCESS RIGHT  10/23/2019  . RADIOLOGY WITH ANESTHESIA N/A 10/23/2019   Procedure: RADIOLOGY WITH ANESTHESIA  EMBOLIZATION;  Surgeon: [name], MD;  Location: [facility];  Service: Radiology;  Laterality: N/A;  . vaginal deliveries     x 3   Social History   Occupational History  . Not on file  Tobacco Use  . Smoking status:  Never Smoker  . Smokeless tobacco: Never Used  Vaping Use  . Vaping Use: Never used  Substance and Sexual Activity  . Alcohol use: No  . Drug use: No  . Sexual activity: Not Currently    Birth control/protection: Surgical    Comment: Hysterectomy

## 2020-04-06 ENCOUNTER — Ambulatory Visit (HOSPITAL_COMMUNITY): Payer: 59 | Admitting: Occupational Therapy

## 2020-04-15 ENCOUNTER — Other Ambulatory Visit: Payer: Self-pay | Admitting: Medical

## 2020-04-15 DIAGNOSIS — Z1231 Encounter for screening mammogram for malignant neoplasm of breast: Secondary | ICD-10-CM

## 2020-05-04 ENCOUNTER — Telehealth: Payer: Self-pay | Admitting: Family Medicine

## 2020-05-04 DIAGNOSIS — M25512 Pain in left shoulder: Secondary | ICD-10-CM

## 2020-05-04 MED ORDER — HYDROCODONE-ACETAMINOPHEN 5-325 MG PO TABS: 1.0000 | ORAL_TABLET | Freq: Two times a day (BID) | ORAL | 0 refills | Status: DC | PRN

## 2020-05-04 NOTE — Telephone Encounter (Signed)
Done

## 2020-05-04 NOTE — Telephone Encounter (Signed)
I called and advised that PT was ordered and they would call her to get her scheduled.  And that he medicine was sent in.

## 2020-05-04 NOTE — Telephone Encounter (Signed)
Patient called. She would like a referral for Physical Therapy. She also would like a refill on hydrocodone called in for her. Her call back number is [phone]

## 2020-05-18 ENCOUNTER — Telehealth: Payer: Self-pay | Admitting: Medical

## 2020-05-18 ENCOUNTER — Other Ambulatory Visit: Payer: Self-pay | Admitting: Medical

## 2020-05-18 DIAGNOSIS — Z111 Encounter for screening for respiratory tuberculosis: Secondary | ICD-10-CM

## 2020-05-18 NOTE — Telephone Encounter (Signed)
Pt called and needs a tb test she starts a new job on the 25th pt can be reached at [phone]

## 2020-05-18 NOTE — Telephone Encounter (Signed)
That is fine.  Schedule lab visit and also needs to get on schedule for fasting physical

## 2020-05-19 NOTE — Telephone Encounter (Signed)
Called and left message for pt  She has cpe for august on the schedule

## 2020-05-21 ENCOUNTER — Encounter: Payer: Self-pay | Admitting: Medical

## 2020-05-21 ENCOUNTER — Other Ambulatory Visit: Payer: Self-pay

## 2020-05-21 ENCOUNTER — Ambulatory Visit (INDEPENDENT_AMBULATORY_CARE_PROVIDER_SITE_OTHER): Payer: Medicare Other | Admitting: Medical

## 2020-05-21 VITALS — BP 120/80 | HR 68 | Ht 62.0 in | Wt 147.0 lb

## 2020-05-21 DIAGNOSIS — R8290 Unspecified abnormal findings in urine: Secondary | ICD-10-CM | POA: Insufficient documentation

## 2020-05-21 DIAGNOSIS — R300 Dysuria: Secondary | ICD-10-CM | POA: Diagnosis not present

## 2020-05-21 DIAGNOSIS — N898 Other specified noninflammatory disorders of vagina: Secondary | ICD-10-CM | POA: Insufficient documentation

## 2020-05-21 LAB — POCT URINALYSIS DIP (PROADVANTAGE DEVICE)
Glucose, UA: NEGATIVE mg/dL
Ketones, POC UA: NEGATIVE mg/dL
Leukocytes, UA: NEGATIVE
Nitrite, UA: NEGATIVE
Specific Gravity, Urine: 1.03
Urobilinogen, Ur: 0.2
pH, UA: 5.5 (ref 5.0–8.0)

## 2020-05-21 MED ORDER — FLUCONAZOLE 150 MG PO TABS: 150.0000 mg | ORAL_TABLET | Freq: Once | ORAL | 0 refills | Status: AC

## 2020-05-21 NOTE — Progress Notes (Signed)
Subjective:  Krystal Houston is a 65 y.o. female who presents for Chief Complaint  Patient presents with  . Vaginal Itching    Vaginal itch with odor for 2 weeks      Here for possible yeast infection or UTI.   She notes about a week hx/o symptoms.   She notes vaginal itching, irritation with urination.   Odor in urine.  No blood in urine.  No vaginal discharge.   No sores.   No urgency.  Had some right flank discomfort. No belly pain.  No fever.  No urine cloudy.  Last UTI 11/2019.  No new partners.  Husband and her separated about a month ago.  No concern for STD though.  No recent sexual activity.  No other aggravating or relieving factors.    No other c/o.  The following portions of the patient's history were reviewed and updated as appropriate: allergies, current medications, past family history, past medical history, past social history, past surgical history and problem list.  ROS Otherwise as in subjective above    Objective: BP 120/80   Pulse 68   Ht 5\' 2"  (1.575 m)   Wt 147 lb (66.7 kg)   SpO2 98%   BMI 26.89 kg/m   General appearance: alert, no distress, well developed, well nourished Abdomen: mild lower abdominal tenderness, no mass, no organomegaly Back: no cva tenderness Gyn - declined/deferred    Assessment: Encounter Diagnoses  Name Primary?  . Dysuria Yes  . Vaginal itching   . Abnormal urine odor      Plan: Discussed symptoms, concerns.  Wet prep + for yeast.   Await urine culture.   Begin diflucan, hydrate well. Call/recheck if worse or not improving.   [name] was seen today for vaginal itching.  Diagnoses and all orders for this visit:  Dysuria -     Urine Culture  Vaginal itching  Abnormal urine odor -     Urine Culture  Other orders -     fluconazole (DIFLUCAN) 150 MG tablet; Take 1 tablet (150 mg total) by mouth once for 1 dose. May repeat in 1 week   Follow up: pending urine culture

## 2020-05-23 LAB — URINE CULTURE

## 2020-05-24 ENCOUNTER — Other Ambulatory Visit: Payer: Self-pay | Admitting: Medical

## 2020-05-24 MED ORDER — NITROFURANTOIN MONOHYD MACRO 100 MG PO CAPS: 100.0000 mg | ORAL_CAPSULE | Freq: Two times a day (BID) | ORAL | 0 refills | Status: AC

## 2020-06-05 ENCOUNTER — Other Ambulatory Visit: Payer: Self-pay | Admitting: Medical

## 2020-06-07 ENCOUNTER — Ambulatory Visit: Payer: 59

## 2020-08-02 ENCOUNTER — Encounter: Payer: BC Managed Care – PPO | Admitting: Medical

## 2020-08-02 ENCOUNTER — Telehealth: Payer: Self-pay | Admitting: Medical

## 2020-08-02 NOTE — Telephone Encounter (Signed)

## 2020-08-05 ENCOUNTER — Encounter: Payer: Self-pay | Admitting: Medical

## 2020-08-16 ENCOUNTER — Encounter: Payer: Self-pay | Admitting: Medical

## 2020-08-17 ENCOUNTER — Ambulatory Visit: Payer: 59

## 2020-09-04 ENCOUNTER — Other Ambulatory Visit: Payer: Self-pay

## 2020-09-04 ENCOUNTER — Ambulatory Visit
Admission: RE | Admit: 2020-09-04 | Discharge: 2020-09-04 | Disposition: A | Discharge status: Home / Self Care | Payer: 59 | Source: Ambulatory Visit | Attending: Medical | Admitting: Medical

## 2020-09-04 DIAGNOSIS — Z1231 Encounter for screening mammogram for malignant neoplasm of breast: Secondary | ICD-10-CM

## 2020-09-23 ENCOUNTER — Other Ambulatory Visit: Payer: Self-pay

## 2020-09-23 ENCOUNTER — Ambulatory Visit (INDEPENDENT_AMBULATORY_CARE_PROVIDER_SITE_OTHER): Payer: Medicare Other | Admitting: Medical

## 2020-09-23 VITALS — BP 120/70 | HR 71 | Temp 97.8°F | Wt 146.4 lb

## 2020-09-23 DIAGNOSIS — N3000 Acute cystitis without hematuria: Secondary | ICD-10-CM | POA: Diagnosis not present

## 2020-09-23 DIAGNOSIS — R300 Dysuria: Secondary | ICD-10-CM | POA: Diagnosis not present

## 2020-09-23 DIAGNOSIS — Z1211 Encounter for screening for malignant neoplasm of colon: Secondary | ICD-10-CM | POA: Insufficient documentation

## 2020-09-23 DIAGNOSIS — Z23 Encounter for immunization: Secondary | ICD-10-CM | POA: Insufficient documentation

## 2020-09-23 LAB — POCT URINALYSIS DIP (PROADVANTAGE DEVICE)
Glucose, UA: NEGATIVE mg/dL
Leukocytes, UA: NEGATIVE
Nitrite, UA: NEGATIVE
Protein Ur, POC: 30 mg/dL — AB
Specific Gravity, Urine: 1.03
Urobilinogen, Ur: NEGATIVE
pH, UA: 5.5 (ref 5.0–8.0)

## 2020-09-23 MED ORDER — AMOXICILLIN 500 MG PO CAPS: 500.0000 mg | ORAL_CAPSULE | Freq: Three times a day (TID) | ORAL | 0 refills | Status: AC

## 2020-09-23 NOTE — Progress Notes (Signed)
Subjective:  Krystal Houston is a 65 y.o. female who presents for Chief Complaint  Patient presents with   possible UTI    Possible UTI. Symptoms started last Thursday or Friday. Burning this morning, smell, has a history of utis, back pain. Overdue for colonoscopy as well. Would like flu shot today     Here for possible yeast infection or UTI.   She notes about a week hx/o symptoms.   Symptoms include urine odor, some polyuria and urgency.  Mild back pain.  Some burning with urination.  She denies blood in the urine, no belly pain, no vaginal discharge, no fever no body aches or chills.  Last UTI May 2022.  At that time we use Macrobid antibiotic.  No other frequent urinary tract infection.  No new partners.  Husband and her separated about a 2 months ago.  No concern for STD though.  No recent sexual activity.    She reports that she could be better with her water intake.  She would like a referral to gastro for colonoscopy.  She cannot recall her last colonoscopy at all  No other aggravating or relieving factors.    No other c/o.  Past Medical History:  Diagnosis Date   Brain aneurysm    Headache    Hyperlipidemia    Hypertension    Patient denies   Leukocytosis    she reports she has had this her whole life, on labs back to 2009   Current Outpatient Medications on File Prior to Visit  Medication Sig Dispense Refill   aspirin 325 MG tablet Take 1 tablet (325 mg total) by mouth daily. 30 tablet 3   lisinopril (ZESTRIL) 10 MG tablet TAKE 1 TABLET BY MOUTH EVERY DAY 90 tablet 1   No current facility-administered medications on file prior to visit.     The following portions of the patient's history were reviewed and updated as appropriate: allergies, current medications, past family history, past medical history, past social history, past surgical history and problem list.  ROS Otherwise as in subjective above    Objective: BP 120/70   Pulse 71   Temp 97.8 F (36.6 C)    Wt 146 lb 6.4 oz (66.4 kg)   BMI 26.78 kg/m   General appearance: alert, no distress, well developed, well nourished Abdomen: mild lower abdominal tenderness, no mass, no organomegaly Back: no cva tenderness Gyn - declined/deferred    Assessment: Encounter Diagnoses  Name Primary?   Burning with urination Yes   Acute cystitis without hematuria    Needs flu shot    Screen for colon cancer      Plan: Discussed symptoms, concerns. Begin amoxicillin, hydrate well.  Plan repeat clean catch urine in 2 weeks  Return soon for physical and pap.  Referral to GI for colon cancer screening  Counseled on the influenza virus vaccine.  Vaccine information sheet given.   High dose Influenza vaccine given after consent obtained.   Anaston was seen today for possible uti.  Diagnoses and all orders for this visit:  Burning with urination -     POCT Urinalysis DIP (Proadvantage Device) -     Urine Culture  Acute cystitis without hematuria  Needs flu shot -     Flu Vaccine QUAD High Dose(Fluad)  Screen for colon cancer -     Ambulatory referral to Gastroenterology  Other orders -     amoxicillin (AMOXIL) 500 MG capsule; Take 1 capsule (500 mg total) by mouth 3 (  three) times daily for 10 days.  Follow up: pending urine culture

## 2020-10-07 ENCOUNTER — Other Ambulatory Visit (INDEPENDENT_AMBULATORY_CARE_PROVIDER_SITE_OTHER): Payer: Medicare Other

## 2020-10-07 ENCOUNTER — Telehealth: Payer: Self-pay

## 2020-10-07 ENCOUNTER — Other Ambulatory Visit: Payer: Self-pay

## 2020-10-07 DIAGNOSIS — R300 Dysuria: Secondary | ICD-10-CM

## 2020-10-07 DIAGNOSIS — R319 Hematuria, unspecified: Secondary | ICD-10-CM

## 2020-10-07 DIAGNOSIS — N3000 Acute cystitis without hematuria: Secondary | ICD-10-CM

## 2020-10-07 LAB — POCT URINALYSIS DIP (PROADVANTAGE DEVICE)
Bilirubin, UA: NEGATIVE
Glucose, UA: NEGATIVE mg/dL
Ketones, POC UA: NEGATIVE mg/dL
Leukocytes, UA: NEGATIVE
Nitrite, UA: NEGATIVE
Protein Ur, POC: NEGATIVE mg/dL
Specific Gravity, Urine: 1.025
Urobilinogen, Ur: 0.2
pH, UA: 6 (ref 5.0–8.0)

## 2020-10-07 NOTE — Telephone Encounter (Signed)
Done KH 

## 2020-10-07 NOTE — Telephone Encounter (Signed)
Pt had a repeat urine and it still showed blood . Culture was ordered but never resulted. Please advise SJ .   Dell

## 2020-10-10 LAB — MICROALBUMIN / CREATININE URINE RATIO
Creatinine, Urine: 162.8 mg/dL
Microalb/Creat Ratio: 10 mg/g creat (ref 0–29)
Microalbumin, Urine: 16.9 ug/mL

## 2020-10-10 LAB — URINE CULTURE

## 2020-10-11 ENCOUNTER — Other Ambulatory Visit: Payer: Self-pay | Admitting: Internal Medicine

## 2020-10-11 DIAGNOSIS — N3000 Acute cystitis without hematuria: Secondary | ICD-10-CM

## 2020-10-12 ENCOUNTER — Other Ambulatory Visit (INDEPENDENT_AMBULATORY_CARE_PROVIDER_SITE_OTHER): Payer: Medicare Other

## 2020-10-12 ENCOUNTER — Other Ambulatory Visit: Payer: Medicare Other

## 2020-10-12 ENCOUNTER — Other Ambulatory Visit: Payer: Self-pay

## 2020-10-12 DIAGNOSIS — N3000 Acute cystitis without hematuria: Secondary | ICD-10-CM | POA: Diagnosis not present

## 2020-10-12 LAB — POCT UA - MICROSCOPIC ONLY
Bacteria, U Microscopic: NEGATIVE
Casts, Ur, LPF, POC: NEGATIVE
Crystals, Ur, HPF, POC: NEGATIVE
Epithelial cells, urine per micros: NEGATIVE
Mucus, UA: NEGATIVE
RBC, Urine, Miroscopic: NEGATIVE (ref 0–2)
WBC, Ur, HPF, POC: NEGATIVE (ref 0–5)
Yeast, UA: NEGATIVE

## 2020-11-04 ENCOUNTER — Telehealth: Payer: Self-pay

## 2020-11-04 ENCOUNTER — Other Ambulatory Visit: Payer: Self-pay

## 2020-11-04 ENCOUNTER — Other Ambulatory Visit (INDEPENDENT_AMBULATORY_CARE_PROVIDER_SITE_OTHER): Payer: Medicare Other

## 2020-11-04 DIAGNOSIS — R300 Dysuria: Secondary | ICD-10-CM

## 2020-11-04 LAB — POCT URINALYSIS DIP (CLINITEK)
Bilirubin, UA: NEGATIVE
Glucose, UA: NEGATIVE mg/dL
Leukocytes, UA: NEGATIVE
Nitrite, UA: NEGATIVE
Spec Grav, UA: >=1.03 — AB (ref 1.010–1.025)
Urobilinogen, UA: 0.2 E.U./dL
pH, UA: 6 (ref 5.0–8.0)

## 2020-11-04 NOTE — Telephone Encounter (Signed)
Pt. Called stating she is still having UTI symptoms. She keeps getting the symptoms over and over again. She did collect her urine this morning and she wanted to know if she could bring that urine sample in to be tested again. Frequent urination and burning with urination.

## 2020-11-04 NOTE — Telephone Encounter (Signed)
Pt called to check on this and I informed her that Krystal Houston is seeing patients so he has not had a chance to review yet. She states she is wanting an answer soon because she has to be at work at 2 pm

## 2020-11-04 NOTE — Telephone Encounter (Signed)
Patient scheduled for lab visit 

## 2020-11-05 ENCOUNTER — Other Ambulatory Visit: Payer: Self-pay | Admitting: Internal Medicine

## 2020-11-05 DIAGNOSIS — N3000 Acute cystitis without hematuria: Secondary | ICD-10-CM

## 2020-11-05 DIAGNOSIS — R300 Dysuria: Secondary | ICD-10-CM

## 2020-11-24 ENCOUNTER — Telehealth: Payer: Self-pay | Admitting: Medical

## 2020-11-24 DIAGNOSIS — Z1211 Encounter for screening for malignant neoplasm of colon: Secondary | ICD-10-CM

## 2020-11-24 NOTE — Telephone Encounter (Signed)
done

## 2020-11-24 NOTE — Telephone Encounter (Signed)
Pt called and states that she another GI referral for her colonoscopy to [place] states that [facility] told her she needs to go to the last place she went to

## 2020-12-02 ENCOUNTER — Telehealth (INDEPENDENT_AMBULATORY_CARE_PROVIDER_SITE_OTHER): Payer: Medicare Other | Admitting: Medical

## 2020-12-02 ENCOUNTER — Encounter: Payer: Medicare Other | Admitting: Medical

## 2020-12-02 ENCOUNTER — Other Ambulatory Visit: Payer: Self-pay

## 2020-12-02 ENCOUNTER — Telehealth: Payer: Self-pay | Admitting: Internal Medicine

## 2020-12-02 VITALS — Temp 101.2°F | Wt 140.0 lb

## 2020-12-02 DIAGNOSIS — J111 Influenza due to unidentified influenza virus with other respiratory manifestations: Secondary | ICD-10-CM

## 2020-12-02 DIAGNOSIS — R110 Nausea: Secondary | ICD-10-CM | POA: Diagnosis not present

## 2020-12-02 DIAGNOSIS — J029 Acute pharyngitis, unspecified: Secondary | ICD-10-CM | POA: Diagnosis not present

## 2020-12-02 MED ORDER — PROMETHAZINE-DM 6.25-15 MG/5ML PO SYRP: 5.0000 mL | ORAL_SOLUTION | Freq: Four times a day (QID) | ORAL | 0 refills | Status: DC | PRN

## 2020-12-02 MED ORDER — LISINOPRIL 10 MG PO TABS: 10.0000 mg | ORAL_TABLET | Freq: Every day | ORAL | 3 refills | Status: DC

## 2020-12-02 NOTE — Telephone Encounter (Signed)
Noted. I will call her when she feeling better

## 2020-12-02 NOTE — Progress Notes (Signed)
Subjective:     Patient ID: Krystal Houston, female   DOB: [birth year], 65 y.o.   MRN: [id number]  This visit type was conducted due to national recommendations for restrictions regarding the COVID-19 Pandemic (e.g. social distancing) in an effort to limit this patient's exposure and mitigate transmission in our community.  Due to their co-morbid illnesses, this patient is at least at moderate risk for complications without adequate follow up.  This format is felt to be most appropriate for this patient at this time.    Documentation for virtual audio and video telecommunications through [place] encounter:  The patient was located at home. The provider was located in the office. The patient did consent to this visit and is aware of possible charges through their insurance for this visit.  The other persons participating in this telemedicine service were none. Time spent on call was 20 minutes and in review of previous records 20 minutes total.  This virtual service is not related to other E/M service within previous 7 days.   HPI Chief Complaint  Patient presents with   Influenza    Tested positive for flu 3 -4 days ago, body aches, dizziness, fever   Virtual consult for flu 3-4 days ago  She notes sore throat, body aches, chills, headache, fever as high as 102.  Hasn't checked temp today.  Some nausea.  No vomiting.  Some cough.  Feels some sob.  No wheezing.   Used some nyquil.  Trying to drink lots of water.  Has some phlegm from cough.  No hx/o asthma or lung disease. Nonsmoker.     She was seen earlier in the week at a clinic close to her home and tested positive.  She states that she was not given the option for Tamiflu.  She also needs a refill on her blood pressure pill  Past Medical History:  Diagnosis Date   Brain aneurysm    Headache    Hyperlipidemia    Hypertension    Patient denies   Leukocytosis    she reports she has had this her whole life, on labs back to  2009   Current Outpatient Medications on File Prior to Visit  Medication Sig Dispense Refill   aspirin 325 MG tablet Take 1 tablet (325 mg total) by mouth daily. 30 tablet 3   No current facility-administered medications on file prior to visit.     Review of Systems As in subjective    Objective:   Physical Exam Due to coronavirus pandemic stay at home measures, patient visit was virtual and they were not examined in person.   Temp (!) 101.2 F (38.4 C)   Wt 140 lb (63.5 kg)   BMI 25.61 kg/m   General: Ill-appearing, otherwise no acute distress No labored breathing, answers questions in complete sentences    Assessment:     Encounter Diagnoses  Name Primary?   Influenza Yes   Sore throat    Nausea        Plan:     We discussed limitations of virtual consult.  We discussed the importance of rest and hydration.  She can use Tylenol or ibuprofen short-term for sore throat pain.  She will continue salt water gargles, warm fluids.  Begin Promethazine DM for cough and nausea.  We discussed that there is not a lot of other treatment other than letting the virus run its course.  She declines inhaler at this point.  She states that she feels like she is  getting a good amount of fluid intake.  Advised if much worse in the next few days, if feeling dehydrated, if very weak, if short of breath gets worse, then go to the emergency department.  Krystal Houston was seen today for influenza.  Diagnoses and all orders for this visit:  Influenza  Sore throat  Nausea  Other orders -     lisinopril (ZESTRIL) 10 MG tablet; Take 1 tablet (10 mg total) by mouth daily. -     promethazine-dextromethorphan (PROMETHAZINE-DM) 6.25-15 MG/5ML syrup; Take 5 mLs by mouth 4 (four) times daily as needed for cough.  F/u prn

## 2020-12-02 NOTE — Telephone Encounter (Signed)
Patient is due for a pneumonia shot. Please advise which one she can get and after she gets over from being sick I will call her to schedule an appointment

## 2020-12-14 ENCOUNTER — Other Ambulatory Visit (INDEPENDENT_AMBULATORY_CARE_PROVIDER_SITE_OTHER): Payer: Medicare Other

## 2020-12-14 ENCOUNTER — Other Ambulatory Visit: Payer: Self-pay

## 2020-12-14 DIAGNOSIS — Z23 Encounter for immunization: Secondary | ICD-10-CM | POA: Diagnosis not present

## 2020-12-17 ENCOUNTER — Telehealth: Payer: Self-pay | Admitting: Gastroenterology

## 2020-12-17 NOTE — Telephone Encounter (Signed)
Patient called to schedule recall however I am not seeing that she had her procedure in 2018. Please confirm when patient is due for recall. Thanks

## 2020-12-18 NOTE — Telephone Encounter (Signed)
Yes, she is past due for recall colonoscopy. She did not do the procedure in 2018. Please schedule colonoscopy, next available appt. Thanks

## 2020-12-20 ENCOUNTER — Encounter: Payer: Self-pay | Admitting: Gastroenterology

## 2020-12-31 ENCOUNTER — Ambulatory Visit (AMBULATORY_SURGERY_CENTER): Payer: 59

## 2020-12-31 ENCOUNTER — Other Ambulatory Visit: Payer: Self-pay

## 2020-12-31 VITALS — Ht 62.0 in | Wt 140.0 lb

## 2020-12-31 DIAGNOSIS — Z1211 Encounter for screening for malignant neoplasm of colon: Secondary | ICD-10-CM

## 2020-12-31 MED ORDER — NA SULFATE-K SULFATE-MG SULF 17.5-3.13-1.6 GM/177ML PO SOLN: 1.0000 | Freq: Once | ORAL | 0 refills | Status: AC

## 2020-12-31 NOTE — Progress Notes (Signed)
Pre visit completed via phone call; Patient verified name, DOB, and address; No egg or soy allergy known to patient  No issues known to pt with past sedation with any surgeries or procedures Patient denies ever being told they had issues or difficulty with intubation  No FH of Malignant Hyperthermia Pt is not on diet pills Pt is not on home 02  Pt is not on blood thinners  Pt denies issues with constipation  No A fib or A flutter Pt is fully vaccinated for Covid x 2 NO PA's for preps discussed with pt in PV today  Discussed with pt there will be an out-of-pocket cost for prep and that varies from $0 to 70 + dollars - pt verbalized understanding  Due to the COVID-19 pandemic we are asking patients to follow certain guidelines in PV and the [facility]   Pt aware of COVID protocols and [facility] guidelines

## 2021-01-02 HISTORY — PX: COLONOSCOPY: SHX174

## 2021-01-04 ENCOUNTER — Telehealth: Payer: Self-pay | Admitting: Gastroenterology

## 2021-01-04 DIAGNOSIS — Z1211 Encounter for screening for malignant neoplasm of colon: Secondary | ICD-10-CM

## 2021-01-04 MED ORDER — PEG 3350-KCL-NA BICARB-NACL 420 G PO SOLR: 4000.0000 mL | Freq: Once | ORAL | 0 refills | Status: AC

## 2021-01-04 NOTE — Telephone Encounter (Signed)
Patient will come pick up new Gol[facility] prep instructions from 2 nd floor and new RX sent to pharmacy-pt aware.

## 2021-01-04 NOTE — Telephone Encounter (Signed)
RX sent to pts pharmacy.  

## 2021-01-04 NOTE — Telephone Encounter (Signed)
Patient came by today to pick up her prep instructions and asked if you could go ahead and call in her prescription to her pharmacy.  She wants to go ahead and pick it up.  Thank you.

## 2021-01-04 NOTE — Telephone Encounter (Signed)
Inbound call from patient stating prep medication costs over $90 which she can not afford.  Please advise.

## 2021-01-05 ENCOUNTER — Encounter: Payer: Self-pay | Admitting: Gastroenterology

## 2021-01-07 ENCOUNTER — Other Ambulatory Visit: Payer: Self-pay

## 2021-01-07 ENCOUNTER — Encounter: Payer: Self-pay | Admitting: Gastroenterology

## 2021-01-07 ENCOUNTER — Ambulatory Visit (AMBULATORY_SURGERY_CENTER): Payer: Commercial Managed Care - HMO | Admitting: Gastroenterology

## 2021-01-07 VITALS — BP 145/84 | HR 62 | Temp 97.1°F | Resp 16 | Ht 62.0 in | Wt 140.0 lb

## 2021-01-07 DIAGNOSIS — D123 Benign neoplasm of transverse colon: Secondary | ICD-10-CM

## 2021-01-07 DIAGNOSIS — Z1211 Encounter for screening for malignant neoplasm of colon: Secondary | ICD-10-CM | POA: Diagnosis not present

## 2021-01-07 DIAGNOSIS — D122 Benign neoplasm of ascending colon: Secondary | ICD-10-CM

## 2021-01-07 DIAGNOSIS — D120 Benign neoplasm of cecum: Secondary | ICD-10-CM | POA: Diagnosis not present

## 2021-01-07 MED ORDER — SODIUM CHLORIDE 0.9 % IV SOLN: 500.0000 mL | Freq: Once | INTRAVENOUS | Status: DC

## 2021-01-07 NOTE — Progress Notes (Signed)
[facility] History and Physical   Primary Care Physician:  Carlena Hurl, PA-C   Reason for Procedure:  Colorectal cancer screening  Plan:    Screening colonoscopy with possible interventions as needed     HPI: Krystal Houston is a very pleasant 66 y.o. female here for screening colonoscopy. Denies any nausea, vomiting, abdominal pain, melena or bright red blood per rectum  The risks and benefits as well as alternatives of endoscopic procedure(s) have been discussed and reviewed. All questions answered. The patient agrees to proceed.    Past Medical History:  Diagnosis Date   Brain aneurysm    Headache    Hyperlipidemia    diet controlled   Hypertension    on meds   Leukocytosis    she reports she has had this her whole life, on labs back to 2009    Past Surgical History:  Procedure Laterality Date   ABDOMINAL HYSTERECTOMY  2005   CEREBRAL ANEURYSM REPAIR  2022   IR 3D INDEPENDENT WKST  08/19/2019   IR ANGIO INTRA EXTRACRAN SEL COM CAROTID INNOMINATE BILAT MOD SED  08/19/2019   IR ANGIO INTRA EXTRACRAN SEL INTERNAL CAROTID UNI L MOD SED  10/23/2019   IR ANGIO VERTEBRAL SEL VERTEBRAL BILAT MOD SED  08/19/2019   IR ANGIOGRAM FOLLOW UP STUDY  10/23/2019   IR CT HEAD LTD  10/23/2019   IR RADIOLOGIST EVAL & MGMT  11/10/2019   IR TRANSCATH/EMBOLIZ  10/23/2019   IR US GUIDE VASC ACCESS RIGHT  08/19/2019   IR US GUIDE VASC ACCESS RIGHT  10/23/2019   RADIOLOGY WITH ANESTHESIA N/A 10/23/2019   Procedure: RADIOLOGY WITH ANESTHESIA  EMBOLIZATION;  Surgeon: [name], MD;  Location: [facility];  Service: Radiology;  Laterality: N/A;   vaginal deliveries     x 3    Prior to Admission medications   Medication Sig Start Date End Date Taking? Authorizing Provider  aspirin 325 MG tablet Take 1 tablet (325 mg total) by mouth daily. 10/25/19   Louk, Bea Graff, PA-C  lisinopril (ZESTRIL) 10 MG tablet Take 1 tablet (10 mg total) by mouth daily. 12/02/20    Tysinger, Camelia Eng, PA-C  promethazine-dextromethorphan (PROMETHAZINE-DM) 6.25-15 MG/5ML syrup Take 5 mLs by mouth 4 (four) times daily as needed for cough. 12/02/20   Tysinger, Camelia Eng, PA-C    Current Outpatient Medications  Medication Sig Dispense Refill   aspirin 325 MG tablet Take 1 tablet (325 mg total) by mouth daily. 30 tablet 3   lisinopril (ZESTRIL) 10 MG tablet Take 1 tablet (10 mg total) by mouth daily. 90 tablet 3   promethazine-dextromethorphan (PROMETHAZINE-DM) 6.25-15 MG/5ML syrup Take 5 mLs by mouth 4 (four) times daily as needed for cough. 120 mL 0   No current facility-administered medications for this visit.    Allergies as of 01/07/2021 - Review Complete 12/31/2020  Allergen Reaction Noted   Sulfamethoxazole Hives, Nausea And Vomiting, and Rash     Family History  Problem Relation Age of Onset   Heart disease Mother    Diabetes Father    Coronary artery disease Other    Diabetes Other    Colon cancer Neg Hx    Esophageal cancer Neg Hx    Rectal cancer Neg Hx    Stomach cancer Neg Hx    Colon polyps Neg Hx     Social History   Socioeconomic History   Marital status: Married    Spouse name: Not on file   Number of children:  Not on file   Years of education: Not on file   Highest education level: Not on file  Occupational History   Not on file  Tobacco Use   Smoking status: Never   Smokeless tobacco: Never  Vaping Use   Vaping Use: Never used  Substance and Sexual Activity   Alcohol use: No   Drug use: No   Sexual activity: Not Currently    Birth control/protection: Surgical    Comment: Hysterectomy  Other Topics Concern   Not on file  Social History Narrative   Not on file   Social Determinants of Health   Financial Resource Strain: Not on file  Food Insecurity: Not on file  Transportation Needs: Not on file  Physical Activity: Not on file  Stress: Not on file  Social Connections: Not on file  Intimate Partner Violence: Not on file     Review of Systems:  All other review of systems negative except as mentioned in the HPI.  Physical Exam: Vital signs in last 24 hours: BP 133/74    Pulse 69    Temp (!) 97.1 F (36.2 C)    Ht 5\' 2"  (1.575 m)    Wt 140 lb (63.5 kg)    SpO2 98%    BMI 25.61 kg/m  General:   Alert, NAD Lungs:  Clear .   Heart:  Regular rate and rhythm Abdomen:  Soft, nontender and nondistended. Neuro/Psych:  Alert and cooperative. Normal mood and affect. A and O x 3  Reviewed labs, radiology imaging, old records and pertinent past GI work up  Patient is appropriate for planned procedure(s) and anesthesia in an ambulatory setting   K. Denzil Magnuson , MD [phone]

## 2021-01-07 NOTE — Progress Notes (Signed)
Called to room to assist during endoscopic procedure.  Patient ID and intended procedure confirmed with present staff. Received instructions for my participation in the procedure from the performing physician.  

## 2021-01-07 NOTE — Progress Notes (Signed)
Pt's states no medical or surgical changes since previsit or office visit. Pt says telephone interview by nurse 12/31/20 ,no changes since

## 2021-01-07 NOTE — Patient Instructions (Signed)
Handout provided on polyps, diverticulosis and hemorrhoids.   YOU HAD AN ENDOSCOPIC PROCEDURE TODAY AT [facility]:   Refer to the procedure report that was given to you for any specific questions about what was found during the examination.  If the procedure report does not answer your questions, please call your gastroenterologist to clarify.  If you requested that your care partner not be given the details of your procedure findings, then the procedure report has been included in a sealed envelope for you to review at your convenience later.  YOU SHOULD EXPECT: Some feelings of bloating in the abdomen. Passage of more gas than usual.  Walking can help get rid of the air that was put into your GI tract during the procedure and reduce the bloating. If you had a lower endoscopy (such as a colonoscopy or flexible sigmoidoscopy) you may notice spotting of blood in your stool or on the toilet paper. If you underwent a bowel prep for your procedure, you may not have a normal bowel movement for a few days.  Please Note:  You might notice some irritation and congestion in your nose or some drainage.  This is from the oxygen used during your procedure.  There is no need for concern and it should clear up in a day or so.  SYMPTOMS TO REPORT IMMEDIATELY:  Following lower endoscopy (colonoscopy or flexible sigmoidoscopy):  Excessive amounts of blood in the stool  Significant tenderness or worsening of abdominal pains  Swelling of the abdomen that is new, acute  Fever of 100F or higher  For urgent or emergent issues, a gastroenterologist can be reached at any hour by calling [phone]. Do not use MyChart messaging for urgent concerns.    DIET:  We do recommend a small meal at first, but then you may proceed to your regular diet.  Drink plenty of fluids but you should avoid alcoholic beverages for 24 hours.  ACTIVITY:  You should plan to take it easy for the rest of today and you  should NOT DRIVE or use heavy machinery until tomorrow (because of the sedation medicines used during the test).    FOLLOW UP: Our staff will call the number listed on your records 48-72 hours following your procedure to check on you and address any questions or concerns that you may have regarding the information given to you following your procedure. If we do not reach you, we will leave a message.  We will attempt to reach you two times.  During this call, we will ask if you have developed any symptoms of COVID 19. If you develop any symptoms (ie: fever, flu-like symptoms, shortness of breath, cough etc.) before then, please call [phone].  If you test positive for Covid 19 in the 2 weeks post procedure, please call and report this information to [place].    If any biopsies were taken you will be contacted by phone or by letter within the next 1-3 weeks.  Please call us at [phone] if you have not heard about the biopsies in 3 weeks.    SIGNATURES/CONFIDENTIALITY: You and/or your care partner have signed paperwork which will be entered into your electronic medical record.  These signatures attest to the fact that that the information above on your After Visit Summary has been reviewed and is understood.  Full responsibility of the confidentiality of this discharge information lies with you and/or your care-partner.

## 2021-01-07 NOTE — Op Note (Signed)
[facility] Patient Name: Krystal Houston Procedure Date: 01/07/2021 10:47 AM MRN: [id number] Endoscopist: Mauri Pole , MD Age: 66 Referring MD:  Date of Birth: 04-Apr-1955 Gender: Female Account #: [id number] Procedure:                Colonoscopy Indications:              Screening for colorectal malignant neoplasm Medicines:                Monitored Anesthesia Care Procedure:                Pre-Anesthesia Assessment:                           - Prior to the procedure, a History and Physical                            was performed, and patient medications and                            allergies were reviewed. The patient's tolerance of                            previous anesthesia was also reviewed. The risks                            and benefits of the procedure and the sedation                            options and risks were discussed with the patient.                            All questions were answered, and informed consent                            was obtained. Prior Anticoagulants: The patient has                            taken no previous anticoagulant or antiplatelet                            agents. ASA Grade Assessment: II - A patient with                            mild systemic disease. After reviewing the risks                            and benefits, the patient was deemed in                            satisfactory condition to undergo the procedure.                           After obtaining informed consent, the colonoscope  was passed under direct vision. Throughout the                            procedure, the patient's blood pressure, pulse, and                            oxygen saturations were monitored continuously. The                            PCF-HQ190L Colonoscope was introduced through the                            anus and advanced to the the cecum, identified by                            appendiceal  orifice and ileocecal valve. The                            colonoscopy was performed without difficulty. The                            patient tolerated the procedure well. The quality                            of the bowel preparation was excellent. The                            ileocecal valve, appendiceal orifice, and rectum                            were photographed. Scope In: 11:01:59 AM Scope Out: 11:27:20 AM Scope Withdrawal Time: 0 hours 17 minutes 29 seconds  Total Procedure Duration: 0 hours 25 minutes 21 seconds  Findings:                 The perianal and digital rectal examinations were                            normal.                           Two sessile polyps were found in the transverse                            colon and cecum. The polyps were 5 to 7 mm in size.                            These polyps were removed with a cold snare.                            Resection and retrieval were complete.                           A 2 mm polyp was found in the ascending colon. The  polyp was sessile. The polyp was removed with a                            cold biopsy forceps. Resection and retrieval were                            complete.                           Scattered small and large-mouthed diverticula were                            found in the sigmoid colon, descending colon,                            transverse colon and ascending colon.                           Non-bleeding external and internal hemorrhoids were                            found during retroflexion. The hemorrhoids were                            medium-sized. Complications:            No immediate complications. Estimated Blood Loss:     Estimated blood loss was minimal. Impression:               - Two 5 to 7 mm polyps in the transverse colon and                            in the cecum, removed with a cold snare. Resected                            and  retrieved.                           - One 2 mm polyp in the ascending colon, removed                            with a cold biopsy forceps. Resected and retrieved.                           - Diverticulosis in the sigmoid colon, in the                            descending colon, in the transverse colon and in                            the ascending colon.                           - Non-bleeding external and internal hemorrhoids. Recommendation:           - Patient has a contact  number available for                            emergencies. The signs and symptoms of potential                            delayed complications were discussed with the                            patient. Return to normal activities tomorrow.                            Written discharge instructions were provided to the                            patient.                           - Resume previous diet.                           - Continue present medications.                           - Await pathology results.                           - Repeat colonoscopy in 3 - 5 years for                            surveillance based on pathology results. Mauri Pole, MD 01/07/2021 11:33:30 AM This report has been signed electronically.

## 2021-01-07 NOTE — Progress Notes (Signed)
A and O x3. Report to RN. Tolerated MAC anesthesia well. 

## 2021-01-11 ENCOUNTER — Telehealth: Payer: Self-pay | Admitting: *Deleted

## 2021-01-11 NOTE — Telephone Encounter (Signed)
°  Follow up Call-  Call back number 01/07/2021  Post procedure Call Back phone  # [phone]  Permission to leave phone message Yes  Some recent data might be hidden     Patient questions:  Do you have a fever, pain , or abdominal swelling? No. Pain Score  0 *  Have you tolerated food without any problems? Yes.    Have you been able to return to your normal activities? Yes.    Do you have any questions about your discharge instructions: Diet   No. Medications  No. Follow up visit  No.  Do you have questions or concerns about your Care? No.  Actions: * If pain score is 4 or above: No action needed, pain <4.  Have you developed a fever since your procedure? no  2.   Have you had an respiratory symptoms (SOB or cough) since your procedure? no  3.   Have you tested positive for COVID 19 since your procedure no  4.   Have you had any family members/close contacts diagnosed with the COVID 19 since your procedure?  no   If yes to any of these questions please route to Joylene John, RN and Joella Prince, RN

## 2021-01-13 ENCOUNTER — Encounter: Payer: Self-pay | Admitting: Gastroenterology

## 2021-01-21 ENCOUNTER — Telehealth: Payer: Self-pay

## 2021-01-21 MED ORDER — COVID-19 AT-HOME TEST VI KIT: 1.0000 | PACK | 5 refills | Status: DC | PRN

## 2021-01-21 NOTE — Telephone Encounter (Signed)
Patient and [employer] called requesting Pfizer Covid tests [facility] [address]  be sent  in prescription and will be covered

## 2021-01-21 NOTE — Telephone Encounter (Signed)
Rx sent 

## 2021-01-25 ENCOUNTER — Encounter: Payer: Medicare Other | Admitting: Medical

## 2021-02-04 ENCOUNTER — Encounter: Payer: Self-pay | Admitting: Medical

## 2021-02-04 ENCOUNTER — Ambulatory Visit (INDEPENDENT_AMBULATORY_CARE_PROVIDER_SITE_OTHER): Payer: Medicare Other | Admitting: Medical

## 2021-02-04 ENCOUNTER — Ambulatory Visit: Payer: Medicare Other | Admitting: Medical

## 2021-02-04 ENCOUNTER — Other Ambulatory Visit: Payer: Self-pay

## 2021-02-04 VITALS — BP 120/78 | HR 65 | Ht 62.0 in | Wt 144.4 lb

## 2021-02-04 DIAGNOSIS — Z8679 Personal history of other diseases of the circulatory system: Secondary | ICD-10-CM

## 2021-02-04 DIAGNOSIS — Z780 Asymptomatic menopausal state: Secondary | ICD-10-CM | POA: Diagnosis not present

## 2021-02-04 DIAGNOSIS — E2839 Other primary ovarian failure: Secondary | ICD-10-CM | POA: Diagnosis not present

## 2021-02-04 DIAGNOSIS — Z0000 Encounter for general adult medical examination without abnormal findings: Secondary | ICD-10-CM | POA: Diagnosis not present

## 2021-02-04 DIAGNOSIS — Z136 Encounter for screening for cardiovascular disorders: Secondary | ICD-10-CM

## 2021-02-04 DIAGNOSIS — Z113 Encounter for screening for infections with a predominantly sexual mode of transmission: Secondary | ICD-10-CM

## 2021-02-04 DIAGNOSIS — M8580 Other specified disorders of bone density and structure, unspecified site: Secondary | ICD-10-CM | POA: Diagnosis not present

## 2021-02-04 DIAGNOSIS — I10 Essential (primary) hypertension: Secondary | ICD-10-CM

## 2021-02-04 DIAGNOSIS — M6289 Other specified disorders of muscle: Secondary | ICD-10-CM

## 2021-02-04 DIAGNOSIS — E785 Hyperlipidemia, unspecified: Secondary | ICD-10-CM

## 2021-02-04 DIAGNOSIS — Z7189 Other specified counseling: Secondary | ICD-10-CM

## 2021-02-04 MED ORDER — ASPIRIN 325 MG PO TABS: 325.0000 mg | ORAL_TABLET | Freq: Every day | ORAL | 3 refills | Status: AC

## 2021-02-04 MED ORDER — LISINOPRIL 10 MG PO TABS: 10.0000 mg | ORAL_TABLET | Freq: Every day | ORAL | 3 refills | Status: AC

## 2021-02-04 NOTE — Patient Instructions (Signed)
This visit was a preventative care visit, also known as wellness visit or routine physical.   Topics typically include healthy lifestyle, diet, exercise, preventative care, vaccinations, sick and well care, proper use of emergency dept and after hours care, as well as other concerns.     Recommendations: Continue to return yearly for your annual wellness and preventative care visits.  This gives [place] a chance to discuss healthy lifestyle, exercise, vaccinations, review your chart record, and perform screenings where appropriate.  I recommend you see your eye doctor yearly for routine vision care.  I recommend you see your dentist yearly for routine dental care including hygiene visits twice yearly.   Vaccination recommendations were reviewed Immunization History  Administered Date(s) Administered   Fluad Quad(high Dose 65+) 09/23/2020   Influenza,inj,Quad PF,6+ Mos 08/30/2012, 10/30/2014, 10/09/2017, 10/24/2019   Influenza-Unspecified 09/03/2013   Moderna Sars-Covid-2 Vaccination 03/13/2019, 04/12/2019   PNEUMOCOCCAL CONJUGATE-20 12/14/2020   Td 01/03/2007    Shingles vaccine:  I recommend you have a shingles vaccine to help prevent shingles or herpes zoster outbreak.   Please call your insurer to inquire about coverage for the Shingrix vaccine given in 2 doses.   Some insurers cover this vaccine after age 72, some cover this after age 5.  If your insurer covers this, then call to schedule appointment to have this vaccine here.  Also call insurance about tetanus booster as you are due for this   Screening for cancer: Colon cancer screening: I reviewed your colonoscopy on file that is up to date from 01/2021, repeat in 3 years  Breast cancer screening: You should perform a self breast exam monthly.   We reviewed recommendations for regular mammograms and breast cancer screening.  Cervical cancer screening: We reviewed recommendations for pap smear screening.  S/p hysterectomy    Skin cancer screening: Check your skin regularly for new changes, growing lesions, or other lesions of concern Come in for evaluation if you have skin lesions of concern.  Lung cancer screening: If you have a greater than 20 pack year history of tobacco use, then you may qualify for lung cancer screening with a chest CT scan.   Please call your insurance company to inquire about coverage for this test.  We currently don't have screenings for other cancers besides breast, cervical, colon, and lung cancers.  If you have a strong family history of cancer or have other cancer screening concerns, please let me know.    Bone health: Get at least 150 minutes of aerobic exercise weekly Get weight bearing exercise at least once weekly Bone density test:  A bone density test is an imaging test that uses a type of X-ray to measure the amount of calcium and other minerals in your bones. The test may be used to diagnose or screen you for a condition that causes weak or thin bones (osteoporosis), predict your risk for a broken bone (fracture), or determine how well your osteoporosis treatment is working. The bone density test is recommended for females 74 and older, or females or males <51 if certain risk factors such as thyroid disease, long term use of steroids such as for asthma or rheumatological issues, vitamin D deficiency, estrogen deficiency, family history of osteoporosis, self or family history of fragility fracture in first degree relative.   Please call to schedule your bone density test.   [facility]  [phone] [address] [place], [place] [address]    Heart health: Get at  least 150 minutes of aerobic exercise weekly Limit alcohol It is important to maintain a healthy blood pressure and healthy cholesterol numbers  Heart disease screening: Screening for heart disease includes screening for blood pressure, fasting lipids,  glucose/diabetes screening, BMI height to weight ratio, reviewed of smoking status, physical activity, and diet.    Goals include blood pressure 120/80 or less, maintaining a healthy lipid/cholesterol profile, preventing diabetes or keeping diabetes numbers under good control, not smoking or using tobacco products, exercising most days per week or at least 150 minutes per week of exercise, and eating healthy variety of fruits and vegetables, healthy oils, and avoiding unhealthy food choices like fried food, fast food, high sugar and high cholesterol foods.    Other tests may possibly include EKG test, CT coronary calcium score, echocardiogram, exercise treadmill stress test.     Medical care options: I recommend you continue to seek care here first for routine care.  We try really hard to have available appointments Monday through Friday daytime hours for sick visits, acute visits, and physicals.  Urgent care should be used for after hours and weekends for significant issues that cannot wait till the next day.  The emergency department should be used for significant potentially life-threatening emergencies.  The emergency department is expensive, can often have long wait times for less significant concerns, so try to utilize primary care, urgent care, or telemedicine when possible to avoid unnecessary trips to the emergency department.  Virtual visits and telemedicine have been introduced since the pandemic started in 2020, and can be convenient ways to receive medical care.  We offer virtual appointments as well to assist you in a variety of options to seek medical care.   Advanced Directives: I recommend you consider completing a [place] and Living Will.   These documents respect your wishes and help alleviate burdens on your loved ones if you were to become terminally ill or be in a position to need those documents enforced.    You can complete Advanced Directives yourself,  have them notarized, then have copies made for our office, for you and for anybody you feel should have them in safe keeping.  Or, you can have an attorney prepare these documents.   If you haven't updated your Last Will and Testament in a while, it may be worthwhile having an attorney prepare these documents together and save on some costs.

## 2021-02-04 NOTE — Progress Notes (Signed)
Subjective:    Krystal Houston is a 66 y.o. female who presents for Preventative Services visit and chronic medical problems/med check visit.    Primary Care Provider Tysinger, Camelia Eng, PA-C here for primary care  Current Health Care Team: Dentist, none Eye doctor, none Dr. Harl Bowie, GI Dr. Eunice Blase, Ortho  Medical Services you may have received from other than [facility] providers in the past year (date may be approximate) none  Exercise Current exercise habits:  walking    Nutrition/Diet Current diet: in general, a "healthy" diet  , well balanced  Depression Screen Depression screen [facility] 2/9 02/04/2021  Decreased Interest 0  Down, Depressed, Hopeless 0  PHQ - 2 Score 0    Activities of Daily Living Screen/Functional Status Survey Is the patient deaf or have difficulty hearing?: No Does the patient have difficulty seeing, even when wearing glasses/contacts?: No Does the patient have difficulty concentrating, remembering, or making decisions?: No Does the patient have difficulty walking or climbing stairs?: No Does the patient have difficulty dressing or bathing?: No Does the patient have difficulty doing errands alone such as visiting a doctor's office or shopping?: No  Can patient draw a clock face showing 3:15 oclock, yes  Fall Risk Screen Fall Risk  02/04/2021 12/02/2020 09/23/2020  Falls in the past year? 0 0 0  Number falls in past yr: 0 0 0  Injury with Fall? 0 0 0  Risk for fall due to : No Fall Risks No Fall Risks No Fall Risks  Follow up Falls evaluation completed Falls evaluation completed Falls evaluation completed    Gait Assessment: Normal gait observed yes  Advanced directives Does patient have a [place]? No Does patient have a Living Will? No  Past Medical History:  Diagnosis Date   Brain aneurysm    Headache    Hyperlipidemia    diet controlled   Hypertension    on meds   Leukocytosis    she reports she has had this  her whole life, on labs back to 2009    Past Surgical History:  Procedure Laterality Date   ABDOMINAL HYSTERECTOMY  2005   CEREBRAL ANEURYSM REPAIR  2022   COLONOSCOPY  01/2021   polypa, diverticulosis, repeat 3 years.   Dr. Harl Bowie   IR 3D INDEPENDENT [place]  08/19/2019   IR ANGIO INTRA EXTRACRAN SEL COM CAROTID INNOMINATE BILAT MOD SED  08/19/2019   IR ANGIO INTRA EXTRACRAN SEL INTERNAL CAROTID UNI L MOD SED  10/23/2019   IR ANGIO VERTEBRAL SEL VERTEBRAL BILAT MOD SED  08/19/2019   IR ANGIOGRAM FOLLOW UP STUDY  10/23/2019   IR CT HEAD LTD  10/23/2019   IR RADIOLOGIST EVAL & MGMT  11/10/2019   IR TRANSCATH/EMBOLIZ  10/23/2019   IR US GUIDE VASC ACCESS RIGHT  08/19/2019   IR US GUIDE VASC ACCESS RIGHT  10/23/2019   RADIOLOGY WITH ANESTHESIA N/A 10/23/2019   Procedure: RADIOLOGY WITH ANESTHESIA  EMBOLIZATION;  Surgeon: [name], MD;  Location: [facility];  Service: Radiology;  Laterality: N/A;   vaginal deliveries     x 3    Social History   Socioeconomic History   Marital status: Married    Spouse name: Not on file   Number of children: Not on file   Years of education: Not on file   Highest education level: Not on file  Occupational History   Not on file  Tobacco Use   Smoking status: Never  Smokeless tobacco: Never  Vaping Use   Vaping Use: Never used  Substance and Sexual Activity   Alcohol use: No   Drug use: No   Sexual activity: Not Currently    Birth control/protection: Surgical    Comment: Hysterectomy  Other Topics Concern   Not on file  Social History Narrative   Lives with son.  No current ignorant other separated from husband.  CNA/sitting with patients/caregiver.  Sometimes goes to [facility].   02/2021.   Social Determinants of Health   Financial Resource Strain: Not on file  Food Insecurity: Not on file  Transportation Needs: Not on file  Physical Activity: Not on file  Stress: Not on file  Social Connections: Not on file  Intimate  Partner Violence: Not on file    Family History  Problem Relation Age of Onset   Cancer Mother        leukemia   Heart disease Mother    Diabetes Father    Diabetes Sister    HIV Sister    Cancer Paternal Aunt    Coronary artery disease Other    Diabetes Other    Colon cancer Neg Hx    Esophageal cancer Neg Hx    Rectal cancer Neg Hx    Stomach cancer Neg Hx    Colon polyps Neg Hx      Current Outpatient Medications:    aspirin 325 MG tablet, Take 1 tablet (325 mg total) by mouth daily., Disp: 90 tablet, Rfl: 3   lisinopril (ZESTRIL) 10 MG tablet, Take 1 tablet (10 mg total) by mouth daily., Disp: 90 tablet, Rfl: 3  Allergies  Allergen Reactions   Sulfamethoxazole Hives, Nausea And Vomiting and Rash    History reviewed: allergies, current medications, past family history, past medical history, past social history, past surgical history and problem list  Chronic issues discussed: Hypertension-compliant with medication.  Needs refills.  Acute issues discussed: She feels a bulge in her vagina particularly when straining or pressing down.  She has a history of hysterectomy  Objective:      Biometrics BP 120/78    Pulse 65    Ht 5\' 2"  (1.575 m)    Wt 144 lb 6.4 oz (65.5 kg)    BMI 26.41 kg/m   Gen: wd, wn, nad HEENT: normocephalic, sclerae anicteric, TMs pearly, nares patent, no discharge or erythema, pharynx normal Oral cavity: MMM, no lesions Neck: supple, no lymphadenopathy, no thyromegaly, no masses Heart: RRR, normal S1, S2, no murmurs Lungs: CTA bilaterally, no wheezes, rhonchi, or rales Abdomen: +bs, soft, non tender, non distended, no masses, no hepatomegaly, no splenomegaly Musculoskeletal: nontender, no swelling, no obvious deformity Extremities: no edema, no cyanosis, no clubbing Pulses: 2+ symmetric, upper and lower extremities, normal cap refill Neurological: alert, oriented x 3, CN2-12 intact, strength normal upper extremities and lower extremities,  sensation normal throughout, DTRs 2+ throughout, no cerebellar signs, gait normal Psychiatric: normal affect, behavior normal, pleasant   Breast: nontender, no masses or lumps, no skin changes, no nipple discharge or inversion, no axillary lymphadenopathy Gyn: Normal external genitalia without lesions, vagina with normal mucosa, there is somewhat of a bulge of urethra and bladder with valsalva, also bulge of rectum upwards with valsalva, s/p hysterectomy, adnexa not enlarged, nontender, no masses.   Exam chaperoned by nurse. Rectal: anus normal appearing  EKG Indication hypertension, rate 66 bpm, PR 176ms, QRS 84 ms, QTC 469ms, axis 61 degrees, nsr, no acute change   Assessment:   Encounter Diagnoses  Name Primary?   Initial Medicare annual wellness visit Yes   Encounter for health maintenance examination in adult    Estrogen deficiency    Postmenopausal    Osteopenia, unspecified location    Hyperlipidemia, unspecified hyperlipidemia type    Essential hypertension    History of aneurysm    Screening for heart disease    Advanced directives, counseling/discussion    Screen for STD (sexually transmitted disease)    Pelvic floor dysfunction      Plan:    This visit was a preventative care visit, also known as wellness visit or routine physical.   Topics typically include healthy lifestyle, diet, exercise, preventative care, vaccinations, sick and well care, proper use of emergency dept and after hours care, as well as other concerns.     Recommendations: Continue to return yearly for your annual wellness and preventative care visits.  This gives [place] a chance to discuss healthy lifestyle, exercise, vaccinations, review your chart record, and perform screenings where appropriate.  I recommend you see your eye doctor yearly for routine vision care.  I recommend you see your dentist yearly for routine dental care including hygiene visits twice yearly.   Vaccination recommendations  were reviewed Immunization History  Administered Date(s) Administered   Fluad Quad(high Dose 65+) 09/23/2020   Influenza,inj,Quad PF,6+ Mos 08/30/2012, 10/30/2014, 10/09/2017, 10/24/2019   Influenza-Unspecified 09/03/2013   Moderna Sars-Covid-2 Vaccination 03/13/2019, 04/12/2019   PNEUMOCOCCAL CONJUGATE-20 12/14/2020   Td 01/03/2007    Shingles vaccine:  I recommend you have a shingles vaccine to help prevent shingles or herpes zoster outbreak.   Please call your insurer to inquire about coverage for the Shingrix vaccine given in 2 doses.   Some insurers cover this vaccine after age 66, some cover this after age 72.  If your insurer covers this, then call to schedule appointment to have this vaccine here.  Also call insurance about tetanus booster as you are due for this   Screening for cancer: Colon cancer screening: I reviewed your colonoscopy on file that is up to date from 01/2021, repeat in 3 years  Breast cancer screening: You should perform a self breast exam monthly.   We reviewed recommendations for regular mammograms and breast cancer screening.  Cervical cancer screening: We reviewed recommendations for pap smear screening.  S/p hysterectomy   Skin cancer screening: Check your skin regularly for new changes, growing lesions, or other lesions of concern Come in for evaluation if you have skin lesions of concern.  Lung cancer screening: If you have a greater than 20 pack year history of tobacco use, then you may qualify for lung cancer screening with a chest CT scan.   Please call your insurance company to inquire about coverage for this test.  We currently don't have screenings for other cancers besides breast, cervical, colon, and lung cancers.  If you have a strong family history of cancer or have other cancer screening concerns, please let me know.    Bone health: Get at least 150 minutes of aerobic exercise weekly Get weight bearing exercise at least once  weekly Bone density test:  A bone density test is an imaging test that uses a type of X-ray to measure the amount of calcium and other minerals in your bones. The test may be used to diagnose or screen you for a condition that causes weak or thin bones (osteoporosis), predict your risk for a broken bone (fracture), or determine how well your osteoporosis treatment is working. The bone  density test is recommended for females 61 and older, or females or males <89 if certain risk factors such as thyroid disease, long term use of steroids such as for asthma or rheumatological issues, vitamin D deficiency, estrogen deficiency, family history of osteoporosis, self or family history of fragility fracture in first degree relative.   Please call to schedule your bone density test.   [facility]  [phone] [address] [place], [place] [address]    Heart health: Get at least 150 minutes of aerobic exercise weekly Limit alcohol It is important to maintain a healthy blood pressure and healthy cholesterol numbers  Heart disease screening: Screening for heart disease includes screening for blood pressure, fasting lipids, glucose/diabetes screening, BMI height to weight ratio, reviewed of smoking status, physical activity, and diet.    Goals include blood pressure 120/80 or less, maintaining a healthy lipid/cholesterol profile, preventing diabetes or keeping diabetes numbers under good control, not smoking or using tobacco products, exercising most days per week or at least 150 minutes per week of exercise, and eating healthy variety of fruits and vegetables, healthy oils, and avoiding unhealthy food choices like fried food, fast food, high sugar and high cholesterol foods.    Other tests may possibly include EKG test, CT coronary calcium score, echocardiogram, exercise treadmill stress test.     Medical care options: I recommend you continue to seek care  here first for routine care.  We try really hard to have available appointments Monday through Friday daytime hours for sick visits, acute visits, and physicals.  Urgent care should be used for after hours and weekends for significant issues that cannot wait till the next day.  The emergency department should be used for significant potentially life-threatening emergencies.  The emergency department is expensive, can often have long wait times for less significant concerns, so try to utilize primary care, urgent care, or telemedicine when possible to avoid unnecessary trips to the emergency department.  Virtual visits and telemedicine have been introduced since the pandemic started in 2020, and can be convenient ways to receive medical care.  We offer virtual appointments as well to assist you in a variety of options to seek medical care.   Advanced Directives: I recommend you consider completing a [place] and Living Will.   These documents respect your wishes and help alleviate burdens on your loved ones if you were to become terminally ill or be in a position to need those documents enforced.    You can complete Advanced Directives yourself, have them notarized, then have copies made for our office, for you and for anybody you feel should have them in safe keeping.  Or, you can have an attorney prepare these documents.   If you haven't updated your Last Will and Testament in a while, it may be worthwhile having an attorney prepare these documents together and save on some costs.        Specific issues Pelvic floor dysfunction, possible cystocele, referral to urology for eval and likely pelvic floor physical therapy  Osteopenia, postmenopausal, history of deficiency-advised updated bone density test  Hypertension - continue current medication  Hyperlipdeima - updated labs today    Galilea was seen today for fasting awv'.  Diagnoses and all orders for this  visit:  Initial Medicare annual wellness visit  Encounter for health maintenance examination in adult -     DG Bone Density; Future -     CBC -  Comprehensive metabolic panel -     Lipid panel -     TSH -     VITAMIN D 25 Hydroxy (Vit-D Deficiency, Fractures) -     RPR+HIV+GC+CT Panel -     Hepatitis C antibody -     Hepatitis B surface antigen -     EKG 12-Lead  Estrogen deficiency -     DG Bone Density; Future -     TSH -     VITAMIN D 25 Hydroxy (Vit-D Deficiency, Fractures)  Postmenopausal -     DG Bone Density; Future -     TSH -     VITAMIN D 25 Hydroxy (Vit-D Deficiency, Fractures)  Osteopenia, unspecified location -     DG Bone Density; Future -     TSH -     VITAMIN D 25 Hydroxy (Vit-D Deficiency, Fractures)  Hyperlipidemia, unspecified hyperlipidemia type -     Lipid panel  Essential hypertension -     Comprehensive metabolic panel  History of aneurysm  Screening for heart disease -     EKG 12-Lead  Advanced directives, counseling/discussion  Screen for STD (sexually transmitted disease) -     RPR+HIV+GC+CT Panel -     Hepatitis C antibody -     Hepatitis B surface antigen  Pelvic floor dysfunction -     Ambulatory referral to Urology  Other orders -     lisinopril (ZESTRIL) 10 MG tablet; Take 1 tablet (10 mg total) by mouth daily. -     aspirin 325 MG tablet; Take 1 tablet (325 mg total) by mouth daily.    Medicare Attestation A preventative services visit was completed today.  During the course of the visit the patient was educated and counseled about appropriate screening and preventive services.  A health risk assessment was established with the patient that included a review of current medications, allergies, social history, family history, medical and preventative health history, biometrics, and preventative screenings to identify potential safety concerns or impairments.  A personalized plan was printed today for the patient's records  and use.   Personalized health advice and education was given today to reduce health risks and promote self management and wellness.  Information regarding end of life planning was discussed today.  Dorothea Ogle, PA-C   02/04/2021

## 2021-02-07 ENCOUNTER — Other Ambulatory Visit: Payer: Self-pay | Admitting: Medical

## 2021-02-07 LAB — RPR+HIV+GC+CT PANEL
Chlamydia trachomatis, NAA: NEGATIVE
HIV Screen 4th Generation wRfx: NONREACTIVE
Neisseria Gonorrhoeae by PCR: NEGATIVE
RPR Ser Ql: NONREACTIVE

## 2021-02-07 LAB — CBC
Hematocrit: 42.6 % (ref 34.0–46.6)
Hemoglobin: 13.8 g/dL (ref 11.1–15.9)
MCH: 26.2 pg — ABNORMAL LOW (ref 26.6–33.0)
MCHC: 32.4 g/dL (ref 31.5–35.7)
MCV: 81 fL (ref 79–97)
Platelets: 301 10*3/uL (ref 150–450)
RBC: 5.27 x10E6/uL (ref 3.77–5.28)
RDW: 13.8 % (ref 11.7–15.4)
WBC: 4.6 10*3/uL (ref 3.4–10.8)

## 2021-02-07 LAB — COMPREHENSIVE METABOLIC PANEL
ALT: 13 IU/L (ref 0–32)
AST: 22 IU/L (ref 0–40)
Albumin/Globulin Ratio: 1.7 (ref 1.2–2.2)
Albumin: 5 g/dL — ABNORMAL HIGH (ref 3.8–4.8)
Alkaline Phosphatase: 67 IU/L (ref 44–121)
BUN/Creatinine Ratio: 14 (ref 12–28)
BUN: 12 mg/dL (ref 8–27)
Bilirubin Total: 0.4 mg/dL (ref 0.0–1.2)
CO2: 21 mmol/L (ref 20–29)
Calcium: 9.7 mg/dL (ref 8.7–10.3)
Chloride: 103 mmol/L (ref 96–106)
Creatinine, Ser: 0.83 mg/dL (ref 0.57–1.00)
Globulin, Total: 3 g/dL (ref 1.5–4.5)
Glucose: 88 mg/dL (ref 70–99)
Potassium: 4.3 mmol/L (ref 3.5–5.2)
Sodium: 143 mmol/L (ref 134–144)
Total Protein: 8 g/dL (ref 6.0–8.5)
eGFR: 78 mL/min/{1.73_m2} (ref >59)

## 2021-02-07 LAB — HEPATITIS B SURFACE ANTIGEN: Hepatitis B Surface Ag: NEGATIVE

## 2021-02-07 LAB — LIPID PANEL
Chol/HDL Ratio: 4.4 ratio (ref 0.0–4.4)
Cholesterol, Total: 285 mg/dL — ABNORMAL HIGH (ref 100–199)
HDL: 65 mg/dL (ref >39)
LDL Chol Calc (NIH): 201 mg/dL — ABNORMAL HIGH (ref 0–99)
Triglycerides: 108 mg/dL (ref 0–149)
VLDL Cholesterol Cal: 19 mg/dL (ref 5–40)

## 2021-02-07 LAB — HEPATITIS C ANTIBODY: Hep C Virus Ab: <0.1 s/co ratio (ref 0.0–0.9)

## 2021-02-07 LAB — VITAMIN D 25 HYDROXY (VIT D DEFICIENCY, FRACTURES): Vit D, 25-Hydroxy: 19.6 ng/mL — ABNORMAL LOW (ref 30.0–100.0)

## 2021-02-07 LAB — TSH: TSH: 0.986 u[IU]/mL (ref 0.450–4.500)

## 2021-02-07 MED ORDER — ROSUVASTATIN CALCIUM 10 MG PO TABS: 10.0000 mg | ORAL_TABLET | Freq: Every day | ORAL | 3 refills | Status: AC

## 2021-02-07 MED ORDER — VITAMIN D (ERGOCALCIFEROL) 1.25 MG (50000 UNIT) PO CAPS: 50000.0000 [IU] | ORAL_CAPSULE | ORAL | 1 refills | Status: AC

## 2021-03-21 ENCOUNTER — Ambulatory Visit: Payer: Medicaid Other | Admitting: Medical

## 2021-03-28 ENCOUNTER — Other Ambulatory Visit: Payer: Self-pay | Admitting: Medical

## 2021-03-28 DIAGNOSIS — M8580 Other specified disorders of bone density and structure, unspecified site: Secondary | ICD-10-CM

## 2021-03-28 DIAGNOSIS — Z780 Asymptomatic menopausal state: Secondary | ICD-10-CM

## 2021-03-28 DIAGNOSIS — Z0000 Encounter for general adult medical examination without abnormal findings: Secondary | ICD-10-CM

## 2021-03-28 DIAGNOSIS — E2839 Other primary ovarian failure: Secondary | ICD-10-CM

## 2021-05-09 ENCOUNTER — Ambulatory Visit: Payer: Medicare Other | Admitting: Medical

## 2021-05-20 ENCOUNTER — Ambulatory Visit: Payer: Medicare Other | Admitting: Medical

## 2021-07-29 ENCOUNTER — Other Ambulatory Visit: Payer: Medicare Other

## 2021-07-29 ENCOUNTER — Other Ambulatory Visit: Payer: Self-pay | Admitting: Medical

## 2021-07-29 DIAGNOSIS — Z111 Encounter for screening for respiratory tuberculosis: Secondary | ICD-10-CM

## 2021-07-29 NOTE — Progress Notes (Signed)
quan

## 2021-08-09 ENCOUNTER — Encounter: Payer: Self-pay | Admitting: Medical

## 2021-08-09 ENCOUNTER — Other Ambulatory Visit (INDEPENDENT_AMBULATORY_CARE_PROVIDER_SITE_OTHER): Payer: Medicare Other

## 2021-08-09 ENCOUNTER — Telehealth: Payer: Self-pay

## 2021-08-09 DIAGNOSIS — N3091 Cystitis, unspecified with hematuria: Secondary | ICD-10-CM

## 2021-08-09 DIAGNOSIS — Z111 Encounter for screening for respiratory tuberculosis: Secondary | ICD-10-CM

## 2021-08-09 DIAGNOSIS — N898 Other specified noninflammatory disorders of vagina: Secondary | ICD-10-CM

## 2021-08-09 LAB — POCT URINALYSIS DIP (PROADVANTAGE DEVICE)
Bilirubin, UA: NEGATIVE
Glucose, UA: 100 mg/dL — AB
Ketones, POC UA: NEGATIVE mg/dL
Leukocytes, UA: NEGATIVE
Nitrite, UA: NEGATIVE
Protein Ur, POC: NEGATIVE mg/dL
Specific Gravity, Urine: 1015
Urobilinogen, Ur: 0.2
pH, UA: 6.5 (ref 5.0–8.0)

## 2021-08-09 MED ORDER — DOXYCYCLINE HYCLATE 100 MG PO TABS: 100.0000 mg | ORAL_TABLET | Freq: Two times a day (BID) | ORAL | 0 refills | Status: DC

## 2021-08-09 NOTE — Telephone Encounter (Signed)
Pt U/A results are in and she advised she was having vaginal itching. Please advise [facility]

## 2021-08-09 NOTE — Progress Notes (Signed)
   Subjective:    Patient ID: Krystal Houston, female    DOB: [birth year], 66 y.o.   MRN: [id number]  HPI She is mainly complaining of vaginal itching.   Review of Systems     Objective:   Physical Exam  Patient not evaluated.  Urinalysis does show red cells.      Assessment & Plan:  Vagina itching - Plan: POCT Urinalysis DIP (Proadvantage Device)  Screening for tuberculosis - Plan: QuantiFERON-TB Gold Plus  Hemorrhagic cystitis - Plan: doxycycline (VIBRA-TABS) 100 MG tablet She will be scheduled to return here

## 2021-08-11 LAB — QUANTIFERON-TB GOLD PLUS
QuantiFERON Mitogen Value: >10 IU/mL
QuantiFERON Nil Value: 0.05 IU/mL
QuantiFERON TB1 Ag Value: 0.1 IU/mL
QuantiFERON TB2 Ag Value: 0.04 IU/mL
QuantiFERON-TB Gold Plus: NEGATIVE

## 2021-08-16 ENCOUNTER — Telehealth: Payer: Self-pay | Admitting: Medical

## 2021-08-16 NOTE — Telephone Encounter (Signed)
Pt called in and says the medication she has been taking for her uti, hasn't helped, and she feels like the pain from her abdomen is moving towards her back and asks if there is anything else she can try.

## 2021-08-18 ENCOUNTER — Ambulatory Visit (INDEPENDENT_AMBULATORY_CARE_PROVIDER_SITE_OTHER): Payer: Medicare Other | Admitting: Medical

## 2021-08-18 VITALS — BP 110/80 | HR 69 | Wt 144.0 lb

## 2021-08-18 DIAGNOSIS — R3129 Other microscopic hematuria: Secondary | ICD-10-CM

## 2021-08-18 DIAGNOSIS — R631 Polydipsia: Secondary | ICD-10-CM

## 2021-08-18 DIAGNOSIS — R7989 Other specified abnormal findings of blood chemistry: Secondary | ICD-10-CM | POA: Diagnosis not present

## 2021-08-18 DIAGNOSIS — R300 Dysuria: Secondary | ICD-10-CM | POA: Diagnosis not present

## 2021-08-18 DIAGNOSIS — R81 Glycosuria: Secondary | ICD-10-CM | POA: Diagnosis not present

## 2021-08-18 LAB — POCT URINALYSIS DIP (PROADVANTAGE DEVICE)
Bilirubin, UA: NEGATIVE
Glucose, UA: 100 mg/dL — AB
Ketones, POC UA: NEGATIVE mg/dL
Leukocytes, UA: NEGATIVE
Nitrite, UA: NEGATIVE
Protein Ur, POC: NEGATIVE mg/dL
Specific Gravity, Urine: 1.025
Urobilinogen, Ur: NEGATIVE
pH, UA: 6 (ref 5.0–8.0)

## 2021-08-18 LAB — POCT GLYCOSYLATED HEMOGLOBIN (HGB A1C): Hemoglobin A1C: 6.2 % — AB (ref 4.0–5.6)

## 2021-08-18 LAB — POCT CBG (FASTING - GLUCOSE)-MANUAL ENTRY: Glucose Fasting, POC: 175 mg/dL — AB (ref 70–99)

## 2021-08-18 MED ORDER — NITROFURANTOIN MONOHYD MACRO 100 MG PO CAPS: 100.0000 mg | ORAL_CAPSULE | Freq: Two times a day (BID) | ORAL | 0 refills | Status: DC

## 2021-08-18 NOTE — Progress Notes (Addendum)
Subjective:  Krystal Houston is a 66 y.o. female who presents for Chief Complaint  Patient presents with   Follow-up    Follow-up from UTI. Still having some back pain, no burning     Here for possible UTI.  Has been having pains in right lower abdomen and flank.  She had come in early august for tuberculosis screen but at that time had some urine concerns, left UA sample here that showed some blood.  Was prescribed antibiotic for UTI.  Felt some improvement while on that antibiotic, but then symptoms have lingered.  Had fever prior but not now.  No nausea, vomiting, no diarrhea.   Has some slight back pain.  No vaginal discharge.  Drinking plenty of water.  Gets UTIs every now and then, particular after sex with husband.   No history of stones.   Has had some thirst, but no blurred vision.  No hx/o diabetes.  No other aggravating or relieving factors.    No other c/o.  Past Medical History:  Diagnosis Date   Brain aneurysm    Headache    Hyperlipidemia    diet controlled   Hypertension    on meds   Leukocytosis    she reports she has had this her whole life, on labs back to 2009   Current Outpatient Medications on File Prior to Visit  Medication Sig Dispense Refill   aspirin 325 MG tablet Take 1 tablet (325 mg total) by mouth daily. 90 tablet 3   lisinopril (ZESTRIL) 10 MG tablet Take 1 tablet (10 mg total) by mouth daily. 90 tablet 3   rosuvastatin (CRESTOR) 10 MG tablet Take 1 tablet (10 mg total) by mouth daily. 90 tablet 3   Vitamin D, Ergocalciferol, (DRISDOL) 1.25 MG (50000 UNIT) CAPS capsule Take 1 capsule (50,000 Units total) by mouth every 7 (seven) days. 12 capsule 1   No current facility-administered medications on file prior to visit.   The following portions of the patient's history were reviewed and updated as appropriate: allergies, current medications, past family history, past medical history, past social history, past surgical history and problem  list.  ROS Otherwise as in subjective above    Objective: BP 110/80   Pulse 69   Wt 144 lb (65.3 kg)   BMI 26.34 kg/m   Wt Readings from Last 3 Encounters:  08/18/21 144 lb (65.3 kg)  02/04/21 144 lb 6.4 oz (65.5 kg)  01/07/21 140 lb (63.5 kg)   General appearance: alert, no distress, well developed, well nourished Back: right CVA tenderness, otherwise nontender Abdomen: +bs, soft, RLQ tendnerss, otherwise non tender, non distended, no masses, no hepatomegaly, no splenomegaly Pulses: 2+ radial pulses, 2+ pedal pulses, normal cap refill Ext: no edema   Assessment: Encounter Diagnoses  Name Primary?   Dysuria Yes   Glucosuria    Polydipsia    Microscopic hematuria      Plan: Dysuria, urinary tract infection-stop doxycycline, begin Macrobid.  Await culture and.  Plan clean-catch urine repeat test in 2 weeks  Unfortunately she had glucosuria today labs showed prediabetes.  We had a discussion about diet, prevention of progression to diabetes, and the possibility of medication to help regulate sugars.  We will plan to see her back in roughly 8 weeks.  She really enjoys her sweets, so we really focused on trying to find healthier ways to eat and reduce sugars   Noelia was seen today for follow-up.  Diagnoses and all orders for this visit:  Dysuria -     Urine Culture -     HgB A1c  Glucosuria -     Glucose (CBG), Fasting  Polydipsia  Microscopic hematuria  Other orders -     nitrofurantoin, macrocrystal-monohydrate, (MACROBID) 100 MG capsule; Take 1 capsule (100 mg total) by mouth 2 (two) times daily.   Follow up: pending culture, repeat urinalysis clean catch in 1-2 weeks

## 2021-08-19 LAB — URINE CULTURE

## 2021-08-23 ENCOUNTER — Ambulatory Visit: Payer: Medicare Other | Admitting: Family Medicine

## 2021-09-12 ENCOUNTER — Inpatient Hospital Stay: Admission: RE | Admit: 2021-09-12 | Payer: 59 | Source: Ambulatory Visit

## 2021-10-05 ENCOUNTER — Ambulatory Visit: Payer: Medicare Other | Admitting: Medical

## 2022-01-12 ENCOUNTER — Ambulatory Visit: Payer: 59 | Admitting: Medical

## 2022-01-18 ENCOUNTER — Ambulatory Visit: Payer: 59 | Admitting: Medical

## 2022-01-18 ENCOUNTER — Encounter: Payer: Self-pay | Admitting: Family Medicine

## 2022-02-06 ENCOUNTER — Ambulatory Visit: Payer: Medicare Other | Admitting: Medical

## 2022-02-13 ENCOUNTER — Encounter (HOSPITAL_COMMUNITY): Payer: Self-pay

## 2022-02-13 ENCOUNTER — Ambulatory Visit (HOSPITAL_COMMUNITY)
Admission: EM | Admit: 2022-02-13 | Discharge: 2022-02-13 | Disposition: A | Discharge status: Home / Self Care | Payer: 59 | Attending: Internal Medicine | Admitting: Internal Medicine

## 2022-02-13 DIAGNOSIS — R300 Dysuria: Secondary | ICD-10-CM

## 2022-02-13 DIAGNOSIS — R350 Frequency of micturition: Secondary | ICD-10-CM | POA: Diagnosis not present

## 2022-02-13 DIAGNOSIS — N3001 Acute cystitis with hematuria: Secondary | ICD-10-CM | POA: Diagnosis not present

## 2022-02-13 LAB — POCT URINALYSIS DIPSTICK, ED / UC
Bilirubin Urine: NEGATIVE
Glucose, UA: NEGATIVE mg/dL
Ketones, ur: NEGATIVE mg/dL
Nitrite: NEGATIVE
Protein, ur: NEGATIVE mg/dL
Specific Gravity, Urine: 1.02 (ref 1.005–1.030)
Urobilinogen, UA: 0.2 mg/dL (ref 0.0–1.0)
pH: 7 (ref 5.0–8.0)

## 2022-02-13 MED ORDER — CEPHALEXIN 500 MG PO CAPS: 500.0000 mg | ORAL_CAPSULE | Freq: Two times a day (BID) | ORAL | 0 refills | Status: AC

## 2022-02-13 NOTE — Discharge Instructions (Signed)
Treating you for urinary tract infection with an antibiotic.  We will send your urine off for culture.  Follow-up if any symptoms persist or worsen.

## 2022-02-13 NOTE — ED Provider Notes (Signed)
[facility]    CSN: [id number] Arrival date & time: 02/13/22  0827      History   Chief Complaint Chief Complaint  Patient presents with   Dysuria   Urinary Frequency    HPI Krystal Houston is a 67 y.o. female.   Patient presents with urinary burning, urinary frequency, right lower back pain that started about 3 to 4 days ago.  Patient denies hematuria, vaginal discharge, abdominal pain, abnormal vaginal bleeding, fever.  Patient reports she has had urinary tract infections in the past but has not had frequent urinary tract infections.   Dysuria Urinary Frequency    Past Medical History:  Diagnosis Date   Brain aneurysm    Headache    Hyperlipidemia    diet controlled   Hypertension    on meds   Leukocytosis    she reports she has had this her whole life, on labs back to 2009    Patient Active Problem List   Diagnosis Date Noted   History of aneurysm 02/04/2021   Postmenopausal 02/04/2021   Estrogen deficiency 02/04/2021   Osteopenia 02/04/2021   Initial Medicare annual wellness visit 02/04/2021   Encounter for health maintenance examination in adult 02/04/2021   Screening for heart disease 02/04/2021   Advanced directives, counseling/discussion 02/04/2021   Screen for STD (sexually transmitted disease) 02/04/2021   Pelvic floor dysfunction 02/04/2021   Hyperlipidemia 11/07/2019   Brain aneurysm 10/23/2019   Essential hypertension 05/22/2007   CEREBRAL ANEURYSM 05/22/2007    Past Surgical History:  Procedure Laterality Date   ABDOMINAL HYSTERECTOMY  2005   CEREBRAL ANEURYSM REPAIR  2022   COLONOSCOPY  01/2021   polypa, diverticulosis, repeat 3 years.   Dr. Harl Bowie   IR 3D INDEPENDENT [place]  08/19/2019   IR ANGIO INTRA EXTRACRAN SEL COM CAROTID INNOMINATE BILAT MOD SED  08/19/2019   IR ANGIO INTRA EXTRACRAN SEL INTERNAL CAROTID UNI L MOD SED  10/23/2019   IR ANGIO VERTEBRAL SEL VERTEBRAL BILAT MOD SED  08/19/2019   IR ANGIOGRAM  FOLLOW UP STUDY  10/23/2019   IR CT HEAD LTD  10/23/2019   IR RADIOLOGIST EVAL & MGMT  11/10/2019   IR TRANSCATH/EMBOLIZ  10/23/2019   IR US GUIDE VASC ACCESS RIGHT  08/19/2019   IR US GUIDE VASC ACCESS RIGHT  10/23/2019   RADIOLOGY WITH ANESTHESIA N/A 10/23/2019   Procedure: RADIOLOGY WITH ANESTHESIA  EMBOLIZATION;  Surgeon: [name], MD;  Location: [facility];  Service: Radiology;  Laterality: N/A;   vaginal deliveries     x 3    OB History   No obstetric history on file.      Home Medications    Prior to Admission medications   Medication Sig Start Date End Date Taking? Authorizing Provider  cephALEXin (KEFLEX) 500 MG capsule Take 1 capsule (500 mg total) by mouth 2 (two) times daily for 7 days. 02/13/22 02/20/22 Yes Teodora Medici, FNP  aspirin 325 MG tablet Take 1 tablet (325 mg total) by mouth daily. 02/04/21   Tysinger, Camelia Eng, PA-C  lisinopril (ZESTRIL) 10 MG tablet Take 1 tablet (10 mg total) by mouth daily. 02/04/21   Tysinger, Camelia Eng, PA-C  nitrofurantoin, macrocrystal-monohydrate, (MACROBID) 100 MG capsule Take 1 capsule (100 mg total) by mouth 2 (two) times daily. 08/18/21   Tysinger, Camelia Eng, PA-C  rosuvastatin (CRESTOR) 10 MG tablet Take 1 tablet (10 mg total) by mouth daily. 02/07/21 02/07/22  Tysinger, Camelia Eng, PA-C  Vitamin D, Ergocalciferol, (  DRISDOL) 1.25 MG (50000 UNIT) CAPS capsule Take 1 capsule (50,000 Units total) by mouth every 7 (seven) days. 02/07/21   Tysinger, Camelia Eng, PA-C    Family History Family History  Problem Relation Age of Onset   Cancer Mother        leukemia   Heart disease Mother    Diabetes Father    Diabetes Sister    HIV Sister    Cancer Paternal Aunt    Coronary artery disease Other    Diabetes Other    Colon cancer Neg Hx    Esophageal cancer Neg Hx    Rectal cancer Neg Hx    Stomach cancer Neg Hx    Colon polyps Neg Hx     Social History Social History   Tobacco Use   Smoking status: Never   Smokeless tobacco: Never   Vaping Use   Vaping Use: Never used  Substance Use Topics   Alcohol use: No   Drug use: No     Allergies   Sulfamethoxazole   Review of Systems Review of Systems Per HPI  Physical Exam Triage Vital Signs ED Triage Vitals  Enc Vitals Group     BP 02/13/22 0935 (!) 139/90     Pulse Rate 02/13/22 0935 67     Resp 02/13/22 0935 16     Temp 02/13/22 0935 98.2 F (36.8 C)     Temp Source 02/13/22 0935 Oral     SpO2 02/13/22 0935 94 %     Weight --      Height --      Head Circumference --      Peak Flow --      Pain Score 02/13/22 0936 8     Pain Loc --      Pain Edu? --      Excl. in [place]? --    No data found.  Updated Vital Signs BP (!) 139/90 (BP Location: Right Arm)   Pulse 67   Temp 98.2 F (36.8 C) (Oral)   Resp 16   SpO2 94%   Visual Acuity Right Eye Distance:   Left Eye Distance:   Bilateral Distance:    Right Eye Near:   Left Eye Near:    Bilateral Near:     Physical Exam Constitutional:      General: She is not in acute distress.    Appearance: Normal appearance. She is diaphoretic. She is not toxic-appearing.  HENT:     Head: Normocephalic and atraumatic.  Eyes:     Extraocular Movements: Extraocular movements intact.     Conjunctiva/sclera: Conjunctivae normal.  Cardiovascular:     Rate and Rhythm: Normal rate and regular rhythm.     Pulses: Normal pulses.     Heart sounds: Normal heart sounds.  Pulmonary:     Effort: Pulmonary effort is normal. No respiratory distress.     Breath sounds: Normal breath sounds.  Abdominal:     General: Bowel sounds are normal. There is no distension.     Palpations: Abdomen is soft.     Tenderness: There is no abdominal tenderness.  Neurological:     General: No focal deficit present.     Mental Status: She is alert and oriented to person, place, and time. Mental status is at baseline.  Psychiatric:        Mood and Affect: Mood normal.        Behavior: Behavior normal.        Thought Content:  Thought content normal.        Judgment: Judgment normal.      UC Treatments / Results  Labs (all labs ordered are listed, but only abnormal results are displayed) Labs Reviewed  POCT URINALYSIS DIPSTICK, ED / UC - Abnormal; Notable for the following components:      Result Value   Hgb urine dipstick TRACE (*)    Leukocytes,Ua TRACE (*)    All other components within normal limits  URINE CULTURE    EKG   Radiology No results found.  Procedures Procedures (including critical care time)  Medications Ordered in UC Medications - No data to display  Initial Impression / Assessment and Plan / UC Course  I have reviewed the triage vital signs and the nursing notes.  Pertinent labs & imaging results that were available during my care of the patient were reviewed by me and considered in my medical decision making (see chart for details).     UA showing trace leukocytes but with associated urinary symptoms, this is indicative of urinary tract infection.  Therefore, will treat with cephalexin antibiotic.  Creatinine clearance is 69 so no dosage adjustment necessary.  Urine culture is pending.  Advised patient to follow-up if any symptoms persist or worsen.  Patient verbalized understanding and was agreeable with plan. Final Clinical Impressions(s) / UC Diagnoses   Final diagnoses:  Acute cystitis with hematuria  Dysuria  Urinary frequency     Discharge Instructions      Treating you for urinary tract infection with an antibiotic.  We will send your urine off for culture.  Follow-up if any symptoms persist or worsen.    ED Prescriptions     Medication Sig Dispense Auth. Provider   cephALEXin (KEFLEX) 500 MG capsule Take 1 capsule (500 mg total) by mouth 2 (two) times daily for 7 days. 14 capsule [place], Michele Rockers, [place]      PDMP not reviewed this encounter.   Teodora Medici, [place] 02/13/22 1106

## 2022-02-13 NOTE — ED Triage Notes (Signed)
Patient c/o dysuria and urinary frequency x 3-4 days.

## 2022-02-14 LAB — URINE CULTURE: Culture: <10000 — AB

## 2022-02-21 ENCOUNTER — Ambulatory Visit (HOSPITAL_COMMUNITY)
Admission: RE | Admit: 2022-02-21 | Discharge: 2022-02-21 | Disposition: A | Discharge status: Home / Self Care | Payer: 59 | Source: Ambulatory Visit | Attending: Physician Assistant | Admitting: Physician Assistant

## 2022-02-21 ENCOUNTER — Encounter (HOSPITAL_COMMUNITY): Payer: Self-pay

## 2022-02-21 ENCOUNTER — Ambulatory Visit (INDEPENDENT_AMBULATORY_CARE_PROVIDER_SITE_OTHER): Payer: 59

## 2022-02-21 VITALS — BP 131/86 | HR 70 | Temp 98.3°F | Resp 15

## 2022-02-21 DIAGNOSIS — K5900 Constipation, unspecified: Secondary | ICD-10-CM | POA: Diagnosis not present

## 2022-02-21 DIAGNOSIS — N3011 Interstitial cystitis (chronic) with hematuria: Secondary | ICD-10-CM | POA: Insufficient documentation

## 2022-02-21 DIAGNOSIS — R1031 Right lower quadrant pain: Secondary | ICD-10-CM | POA: Diagnosis not present

## 2022-02-21 DIAGNOSIS — R109 Unspecified abdominal pain: Secondary | ICD-10-CM | POA: Diagnosis not present

## 2022-02-21 DIAGNOSIS — R319 Hematuria, unspecified: Secondary | ICD-10-CM

## 2022-02-21 LAB — POCT URINALYSIS DIPSTICK, ED / UC
Bilirubin Urine: NEGATIVE
Glucose, UA: NEGATIVE mg/dL
Ketones, ur: NEGATIVE mg/dL
Leukocytes,Ua: NEGATIVE
Nitrite: NEGATIVE
Protein, ur: NEGATIVE mg/dL
Specific Gravity, Urine: >=1.03 (ref 1.005–1.030)
Urobilinogen, UA: 0.2 mg/dL (ref 0.0–1.0)
pH: 5.5 (ref 5.0–8.0)

## 2022-02-21 MED ORDER — PSYLLIUM 58.6 % PO PACK: 1.0000 | PACK | Freq: Every day | ORAL | 0 refills | Status: AC

## 2022-02-21 NOTE — ED Provider Notes (Signed)
[place]    CSN: [id number] Arrival date & time: 02/21/22  1724      History   Chief Complaint Chief Complaint  Patient presents with   apt 530    HPI Krystal Houston is a 67 y.o. female.   Patient presents today with a week plus long history lower back pain, right lower quadrant abdominal pain, urinary symptoms including frequency, urgency, dysuria.  She was seen 02/13/2022 at which point she was started on Keflex and urine culture grew less than 10,000 colony-forming units.  She completed course of antibiotics but did not have resolution of symptoms.  Denies history of recurrent UTI, history of nephrolithiasis, single kidney, self-catheterization, recent urogenital procedure.  She denies any vaginal discharge or pelvic pain.  Denies previous abdominal surgery and still has appendix.  Denies any fever, nausea, vomiting.  Reports that pain is rated 8 on a 0-10 pain scale, described as pressure/aching, no aggravating or alleviating factors identified.  Denies any known injury or increase in activity prior to symptom onset.  She has not tried any over-the-counter medication for symptom management.    Past Medical History:  Diagnosis Date   Brain aneurysm    Headache    Hyperlipidemia    diet controlled   Hypertension    on meds   Leukocytosis    she reports she has had this her whole life, on labs back to 2009    Patient Active Problem List   Diagnosis Date Noted   History of aneurysm 02/04/2021   Postmenopausal 02/04/2021   Estrogen deficiency 02/04/2021   Osteopenia 02/04/2021   Initial Medicare annual wellness visit 02/04/2021   Encounter for health maintenance examination in adult 02/04/2021   Screening for heart disease 02/04/2021   Advanced directives, counseling/discussion 02/04/2021   Screen for STD (sexually transmitted disease) 02/04/2021   Pelvic floor dysfunction 02/04/2021   Hyperlipidemia 11/07/2019   Brain aneurysm 10/23/2019   Essential  hypertension 05/22/2007   CEREBRAL ANEURYSM 05/22/2007    Past Surgical History:  Procedure Laterality Date   ABDOMINAL HYSTERECTOMY  2005   CEREBRAL ANEURYSM REPAIR  2022   COLONOSCOPY  01/2021   polypa, diverticulosis, repeat 3 years.   Dr. Harl Bowie   IR 3D INDEPENDENT [place]  08/19/2019   IR ANGIO INTRA EXTRACRAN SEL COM CAROTID INNOMINATE BILAT MOD SED  08/19/2019   IR ANGIO INTRA EXTRACRAN SEL INTERNAL CAROTID UNI L MOD SED  10/23/2019   IR ANGIO VERTEBRAL SEL VERTEBRAL BILAT MOD SED  08/19/2019   IR ANGIOGRAM FOLLOW UP STUDY  10/23/2019   IR CT HEAD LTD  10/23/2019   IR RADIOLOGIST EVAL & MGMT  11/10/2019   IR TRANSCATH/EMBOLIZ  10/23/2019   IR US GUIDE VASC ACCESS RIGHT  08/19/2019   IR US GUIDE VASC ACCESS RIGHT  10/23/2019   RADIOLOGY WITH ANESTHESIA N/A 10/23/2019   Procedure: RADIOLOGY WITH ANESTHESIA  EMBOLIZATION;  Surgeon: [name], MD;  Location: [facility];  Service: Radiology;  Laterality: N/A;   vaginal deliveries     x 3    OB History   No obstetric history on file.      Home Medications    Prior to Admission medications   Medication Sig Start Date End Date Taking? Authorizing Provider  psyllium (METAMUCIL) 58.6 % packet Take 1 packet by mouth daily. 02/21/22  Yes Neyra Pettie, Derry Skill, PA-C  aspirin 325 MG tablet Take 1 tablet (325 mg total) by mouth daily. 02/04/21   Tysinger, Camelia Eng, PA-C  lisinopril (ZESTRIL) 10 MG tablet Take 1 tablet (10 mg total) by mouth daily. 02/04/21   Tysinger, Camelia Eng, PA-C  rosuvastatin (CRESTOR) 10 MG tablet Take 1 tablet (10 mg total) by mouth daily. 02/07/21 02/07/22  Tysinger, Camelia Eng, PA-C  Vitamin D, Ergocalciferol, (DRISDOL) 1.25 MG (50000 UNIT) CAPS capsule Take 1 capsule (50,000 Units total) by mouth every 7 (seven) days. 02/07/21   Tysinger, Camelia Eng, PA-C    Family History Family History  Problem Relation Age of Onset   Cancer Mother        leukemia   Heart disease Mother    Diabetes Father    Diabetes Sister     HIV Sister    Cancer Paternal Aunt    Coronary artery disease Other    Diabetes Other    Colon cancer Neg Hx    Esophageal cancer Neg Hx    Rectal cancer Neg Hx    Stomach cancer Neg Hx    Colon polyps Neg Hx     Social History Social History   Tobacco Use   Smoking status: Never   Smokeless tobacco: Never  Vaping Use   Vaping Use: Never used  Substance Use Topics   Alcohol use: No   Drug use: No     Allergies   Sulfamethoxazole   Review of Systems Review of Systems  Constitutional:  Positive for activity change. Negative for appetite change, fatigue and fever.  Gastrointestinal:  Positive for abdominal pain. Negative for diarrhea, nausea and vomiting.  Genitourinary:  Positive for dysuria, frequency and urgency. Negative for vaginal bleeding, vaginal discharge and vaginal pain.  Musculoskeletal:  Positive for back pain. Negative for arthralgias and myalgias.     Physical Exam Triage Vital Signs ED Triage Vitals  Enc Vitals Group     BP 02/21/22 1738 131/86     Pulse Rate 02/21/22 1738 70     Resp 02/21/22 1738 15     Temp 02/21/22 1738 98.3 F (36.8 C)     Temp Source 02/21/22 1738 Oral     SpO2 02/21/22 1738 96 %     Weight --      Height --      Head Circumference --      Peak Flow --      Pain Score 02/21/22 1736 8     Pain Loc --      Pain Edu? --      Excl. in [place]? --    No data found.  Updated Vital Signs BP 131/86 (BP Location: Right Arm)   Pulse 70   Temp 98.3 F (36.8 C) (Oral)   Resp 15   SpO2 96%   Visual Acuity Right Eye Distance:   Left Eye Distance:   Bilateral Distance:    Right Eye Near:   Left Eye Near:    Bilateral Near:     Physical Exam Vitals reviewed.  Constitutional:      General: She is awake. She is not in acute distress.    Appearance: Normal appearance. She is well-developed. She is not ill-appearing.     Comments: Very pleasant female appears stated age in no acute distress sitting comfortably in exam room   HENT:     Head: Normocephalic and atraumatic.  Cardiovascular:     Rate and Rhythm: Normal rate and regular rhythm.     Heart sounds: Normal heart sounds, S1 normal and S2 normal. No murmur heard. Pulmonary:     Effort: Pulmonary effort is  normal.     Breath sounds: Normal breath sounds. No wheezing, rhonchi or rales.     Comments: Clear to auscultation  Abdominal:     General: Bowel sounds are normal.     Palpations: Abdomen is soft.     Tenderness: There is no abdominal tenderness. There is no right CVA tenderness, left CVA tenderness, guarding or rebound.  Musculoskeletal:     Cervical back: No tenderness or bony tenderness.     Thoracic back: No tenderness or bony tenderness.     Lumbar back: Tenderness present. No bony tenderness.  Psychiatric:        Behavior: Behavior is cooperative.      UC Treatments / Results  Labs (all labs ordered are listed, but only abnormal results are displayed) Labs Reviewed  POCT URINALYSIS DIPSTICK, ED / UC - Abnormal; Notable for the following components:      Result Value   Hgb urine dipstick MODERATE (*)    All other components within normal limits  URINE CULTURE    EKG   Radiology DG Abdomen 1 View  Result Date: 02/21/2022 CLINICAL DATA:  Hematuria, right lower quadrant and flank pain. EXAM: ABDOMEN - 1 VIEW COMPARISON:  None Available. FINDINGS: There is a nonobstructive bowel gas pattern. There is no definite renal or ureteral stone. Ill-defined calcifications in the pelvis are favored to reflect phleboliths. There is a moderate colonic stool burden. There is no definite free intraperitoneal air, within the confines of supine technique. There is no acute osseous abnormality. There is degenerative change in the lower lumbar spine. IMPRESSION: No renal or ureteral stones identified. Moderate stool burden in a nonobstructive pattern. Electronically Signed   By: Valetta Mole M.D.   On: 02/21/2022 18:22    Procedures Procedures  (including critical care time)  Medications Ordered in UC Medications - No data to display  Initial Impression / Assessment and Plan / UC Course  I have reviewed the triage vital signs and the nursing notes.  Pertinent labs & imaging results that were available during my care of the patient were reviewed by me and considered in my medical decision making (see chart for details).     Patient is well-appearing, afebrile, nontoxic, nontachycardic.  Vital signs and physical exam are reassuring today with no indication for emergent evaluation or imaging.  UA was repeated which showed hematuria without any other abnormalities.  KUB was obtained that showed no renal or ureteral stones.  Discussed symptoms are likely related to interstitial cystitis and she was encouraged to follow-up with urology.  She was given contact information for local provider with instruction to call to schedule an appointment.  Urine culture was obtained of urine but we will defer antibiotics until culture results are available.  Recommended that she drink plenty fluid and avoid caffeine and other bladder irritants.  KUB did show some constipation and so she was encouraged to increase fiber; fiber supplement was sent to pharmacy.  Recommended that she drink plenty of fluid.  Recommended close follow-up with her primary care.  Discussed that if she has any worsening or changing symptoms including severe abdominal pain, nausea, vomiting, changes in bowel habits, fever, weakness she needs to be seen immediately.  Strict return precautions given.  Final Clinical Impressions(s) / UC Diagnoses   Final diagnoses:  RLQ abdominal pain  Constipation, unspecified constipation type  Interstitial cystitis (chronic) with hematuria     Discharge Instructions      Your x-ray did not show any evidence  of a kidney stone.  It did show some constipation.  I have called in some fiber supplements to encourage more regular bowel movements.  I  also recommend you drink plenty of fluid.  Your urine had some blood but was otherwise normal.  We will send this for culture but wait to start antibiotics.  I am concerned that you have something called interstitial cystitis which can be irritation of the bladder without infection.  I recommend you follow-up with urology; call to schedule an appointment.  If you have any worsening symptoms including severe abdominal pain, fever, nausea, vomiting you should be seen immediately.     ED Prescriptions     Medication Sig Dispense Auth. Provider   psyllium (METAMUCIL) 58.6 % packet Take 1 packet by mouth daily. 30 each Shavonn Convey, Derry Skill, PA-C      PDMP not reviewed this encounter.   Terrilee Croak, PA-C 02/21/22 1847

## 2022-02-21 NOTE — ED Triage Notes (Signed)
Pt seen last week for UTI, took meds as prescribed. Reports still having RLQ and back pains as well as headaches.

## 2022-02-21 NOTE — Discharge Instructions (Signed)
Your x-ray did not show any evidence of a kidney stone.  It did show some constipation.  I have called in some fiber supplements to encourage more regular bowel movements.  I also recommend you drink plenty of fluid.  Your urine had some blood but was otherwise normal.  We will send this for culture but wait to start antibiotics.  I am concerned that you have something called interstitial cystitis which can be irritation of the bladder without infection.  I recommend you follow-up with urology; call to schedule an appointment.  If you have any worsening symptoms including severe abdominal pain, fever, nausea, vomiting you should be seen immediately.

## 2022-02-23 LAB — URINE CULTURE: Culture: NO GROWTH

## 2022-03-29 ENCOUNTER — Other Ambulatory Visit: Payer: Self-pay | Admitting: Internal Medicine

## 2022-03-29 DIAGNOSIS — E559 Vitamin D deficiency, unspecified: Secondary | ICD-10-CM | POA: Diagnosis not present

## 2022-03-29 DIAGNOSIS — Z131 Encounter for screening for diabetes mellitus: Secondary | ICD-10-CM | POA: Diagnosis not present

## 2022-03-29 DIAGNOSIS — Z0000 Encounter for general adult medical examination without abnormal findings: Secondary | ICD-10-CM | POA: Diagnosis not present

## 2022-03-29 DIAGNOSIS — E7849 Other hyperlipidemia: Secondary | ICD-10-CM | POA: Diagnosis not present

## 2022-03-29 DIAGNOSIS — I10 Essential (primary) hypertension: Secondary | ICD-10-CM | POA: Diagnosis not present

## 2022-03-29 DIAGNOSIS — N390 Urinary tract infection, site not specified: Secondary | ICD-10-CM | POA: Diagnosis not present

## 2022-03-29 DIAGNOSIS — Z1239 Encounter for other screening for malignant neoplasm of breast: Secondary | ICD-10-CM | POA: Diagnosis not present

## 2022-03-30 LAB — COMPLETE METABOLIC PANEL WITH GFR
AG Ratio: 1.6 (calc) (ref 1.0–2.5)
ALT: 14 U/L (ref 6–29)
AST: 20 U/L (ref 10–35)
Albumin: 4.5 g/dL (ref 3.6–5.1)
Alkaline phosphatase (APISO): 54 U/L (ref 37–153)
BUN: 12 mg/dL (ref 7–25)
CO2: 24 mmol/L (ref 20–32)
Calcium: 9.6 mg/dL (ref 8.6–10.4)
Chloride: 104 mmol/L (ref 98–110)
Creat: 0.85 mg/dL (ref 0.50–1.05)
Globulin: 2.9 g/dL (calc) (ref 1.9–3.7)
Glucose, Bld: 63 mg/dL — ABNORMAL LOW (ref 65–99)
Potassium: 4 mmol/L (ref 3.5–5.3)
Sodium: 140 mmol/L (ref 135–146)
Total Bilirubin: 0.5 mg/dL (ref 0.2–1.2)
Total Protein: 7.4 g/dL (ref 6.1–8.1)
eGFR: 76 mL/min/{1.73_m2} (ref >=60)

## 2022-03-30 LAB — LIPID PANEL
Cholesterol: 164 mg/dL (ref <200)
HDL: 58 mg/dL (ref >=50)
LDL Cholesterol (Calc): 81 mg/dL (calc)
Non-HDL Cholesterol (Calc): 106 mg/dL (calc) (ref <130)
Total CHOL/HDL Ratio: 2.8 (calc) (ref <5.0)
Triglycerides: 155 mg/dL — ABNORMAL HIGH (ref <150)

## 2022-03-30 LAB — URINE CULTURE
MICRO NUMBER:: 14749268
SPECIMEN QUALITY:: ADEQUATE

## 2022-03-30 LAB — VITAMIN B12: Vitamin B-12: 451 pg/mL (ref 200–1100)

## 2022-03-30 LAB — CBC
HCT: 40.1 % (ref 35.0–45.0)
Hemoglobin: 13 g/dL (ref 11.7–15.5)
MCH: 26.5 pg — ABNORMAL LOW (ref 27.0–33.0)
MCHC: 32.4 g/dL (ref 32.0–36.0)
MCV: 81.7 fL (ref 80.0–100.0)
MPV: 11.3 fL (ref 7.5–12.5)
Platelets: 294 10*3/uL (ref 140–400)
RBC: 4.91 10*6/uL (ref 3.80–5.10)
RDW: 13.3 % (ref 11.0–15.0)
WBC: 4.1 10*3/uL (ref 3.8–10.8)

## 2022-03-30 LAB — TSH: TSH: 0.92 mIU/L (ref 0.40–4.50)

## 2022-03-30 LAB — FOLATE: Folate: >24 ng/mL

## 2022-03-30 LAB — VITAMIN D 25 HYDROXY (VIT D DEFICIENCY, FRACTURES): Vit D, 25-Hydroxy: 41 ng/mL (ref 30–100)

## 2022-04-10 ENCOUNTER — Other Ambulatory Visit: Payer: Self-pay | Admitting: Internal Medicine

## 2022-04-10 DIAGNOSIS — Z1231 Encounter for screening mammogram for malignant neoplasm of breast: Secondary | ICD-10-CM

## 2022-04-12 DIAGNOSIS — E7849 Other hyperlipidemia: Secondary | ICD-10-CM | POA: Diagnosis not present

## 2022-04-12 DIAGNOSIS — I10 Essential (primary) hypertension: Secondary | ICD-10-CM | POA: Diagnosis not present

## 2022-05-02 DIAGNOSIS — I10 Essential (primary) hypertension: Secondary | ICD-10-CM | POA: Diagnosis not present

## 2022-05-02 DIAGNOSIS — N644 Mastodynia: Secondary | ICD-10-CM | POA: Diagnosis not present

## 2022-05-02 DIAGNOSIS — H6122 Impacted cerumen, left ear: Secondary | ICD-10-CM | POA: Diagnosis not present

## 2022-05-03 ENCOUNTER — Other Ambulatory Visit: Payer: Self-pay | Admitting: Internal Medicine

## 2022-05-03 DIAGNOSIS — N644 Mastodynia: Secondary | ICD-10-CM

## 2022-05-15 ENCOUNTER — Ambulatory Visit
Admission: RE | Admit: 2022-05-15 | Discharge: 2022-05-15 | Disposition: A | Discharge status: Home / Self Care | Payer: 59 | Source: Ambulatory Visit | Attending: Internal Medicine | Admitting: Internal Medicine

## 2022-05-15 DIAGNOSIS — N644 Mastodynia: Secondary | ICD-10-CM

## 2022-06-21 DIAGNOSIS — R030 Elevated blood-pressure reading, without diagnosis of hypertension: Secondary | ICD-10-CM | POA: Diagnosis not present

## 2022-06-21 DIAGNOSIS — B353 Tinea pedis: Secondary | ICD-10-CM | POA: Diagnosis not present

## 2022-07-04 DIAGNOSIS — L818 Other specified disorders of pigmentation: Secondary | ICD-10-CM | POA: Diagnosis not present

## 2022-07-19 DIAGNOSIS — B353 Tinea pedis: Secondary | ICD-10-CM | POA: Diagnosis not present

## 2022-07-19 DIAGNOSIS — R030 Elevated blood-pressure reading, without diagnosis of hypertension: Secondary | ICD-10-CM | POA: Diagnosis not present

## 2022-08-01 DIAGNOSIS — R5383 Other fatigue: Secondary | ICD-10-CM | POA: Diagnosis not present

## 2022-08-01 DIAGNOSIS — I10 Essential (primary) hypertension: Secondary | ICD-10-CM | POA: Diagnosis not present

## 2022-08-01 DIAGNOSIS — L818 Other specified disorders of pigmentation: Secondary | ICD-10-CM | POA: Diagnosis not present

## 2022-08-01 DIAGNOSIS — E7849 Other hyperlipidemia: Secondary | ICD-10-CM | POA: Diagnosis not present

## 2022-09-05 ENCOUNTER — Emergency Department (HOSPITAL_COMMUNITY)
Admission: EM | Admit: 2022-09-05 | Discharge: 2022-09-05 | Disposition: A | Discharge status: Home / Self Care | Payer: 59 | Attending: Emergency Medicine | Admitting: Emergency Medicine

## 2022-09-05 ENCOUNTER — Encounter (HOSPITAL_COMMUNITY): Payer: Self-pay | Admitting: *Deleted

## 2022-09-05 ENCOUNTER — Emergency Department (HOSPITAL_COMMUNITY): Payer: 59

## 2022-09-05 ENCOUNTER — Other Ambulatory Visit: Payer: Self-pay

## 2022-09-05 DIAGNOSIS — Z7982 Long term (current) use of aspirin: Secondary | ICD-10-CM | POA: Diagnosis not present

## 2022-09-05 DIAGNOSIS — U071 COVID-19: Secondary | ICD-10-CM | POA: Diagnosis not present

## 2022-09-05 DIAGNOSIS — R059 Cough, unspecified: Secondary | ICD-10-CM | POA: Diagnosis not present

## 2022-09-05 LAB — SARS CORONAVIRUS 2 BY RT PCR: SARS Coronavirus 2 by RT PCR: POSITIVE — AB

## 2022-09-05 MED ORDER — BENZONATATE 100 MG PO CAPS: 200.0000 mg | ORAL_CAPSULE | Freq: Three times a day (TID) | ORAL | 0 refills | Status: AC | PRN

## 2022-09-05 MED ORDER — BENZONATATE 100 MG PO CAPS
200.0000 mg | ORAL_CAPSULE | Freq: Once | ORAL | Status: AC
  Administered 2022-09-05: 200 mg via ORAL
  Filled 2022-09-05: qty 2

## 2022-09-05 MED ORDER — ACETAMINOPHEN 325 MG PO TABS
650.0000 mg | ORAL_TABLET | Freq: Once | ORAL | Status: AC
  Administered 2022-09-05: 650 mg via ORAL
  Filled 2022-09-05: qty 2

## 2022-09-05 NOTE — ED Triage Notes (Signed)
Pt believes she has been exposed to Covid recently, pt with c/o HA and cough since Sunday.  Worse today.

## 2022-09-05 NOTE — ED Provider Notes (Signed)
[place] EMERGENCY DEPARTMENT AT [facility] Provider Note   CSN: [id number] Arrival date & time: 09/05/22  1209     History  Chief Complaint  Patient presents with   Cough    Krystal Houston is a 67 y.o. female presenting with a 2-day history of flulike symptoms including generalized fatigue, myalgias along with a mild frontal headache along with a nonproductive cough. low-grade subjective fever, has taken Tylenol with improvement in symptoms.  She has had a recent exposure to someone with COVID 19.  She denies shortness of breath, orthopnea, peripheral edema, denies chest pain.  She also denies abdominal pain, dysuria, she is able to maintain p.o. intake.  The history is provided by the patient.       Home Medications Prior to Admission medications   Medication Sig Start Date End Date Taking? Authorizing Provider  benzonatate (TESSALON) 100 MG capsule Take 2 capsules (200 mg total) by mouth 3 (three) times daily as needed. 09/05/22  Yes Effa Yarrow, Raynelle Fanning, PA-C  aspirin 325 MG tablet Take 1 tablet (325 mg total) by mouth daily. 02/04/21   Tysinger, Kermit Balo, PA-C  lisinopril (ZESTRIL) 10 MG tablet Take 1 tablet (10 mg total) by mouth daily. 02/04/21   Tysinger, Kermit Balo, PA-C  psyllium (METAMUCIL) 58.6 % packet Take 1 packet by mouth daily. 02/21/22   Raspet, Denny Peon K, PA-C  rosuvastatin (CRESTOR) 10 MG tablet Take 1 tablet (10 mg total) by mouth daily. 02/07/21 02/07/22  Tysinger, Kermit Balo, PA-C  Vitamin D, Ergocalciferol, (DRISDOL) 1.25 MG (50000 UNIT) CAPS capsule Take 1 capsule (50,000 Units total) by mouth every 7 (seven) days. 02/07/21   Tysinger, Kermit Balo, PA-C      Allergies    Sulfamethoxazole    Review of Systems   Review of Systems  Constitutional:  Positive for appetite change, chills, fatigue and fever.  HENT:  Negative for congestion, ear pain, rhinorrhea, sinus pressure, sore throat, trouble swallowing and voice change.   Eyes:  Negative for discharge.  Respiratory:  Positive  for cough. Negative for shortness of breath, wheezing and stridor.   Cardiovascular:  Negative for chest pain.  Gastrointestinal:  Negative for abdominal pain, nausea and vomiting.  Genitourinary: Negative.   Musculoskeletal:  Positive for myalgias.  All other systems reviewed and are negative.   Physical Exam Updated Vital Signs BP (!) 144/88 (BP Location: Right Arm)   Pulse 80   Temp 100.2 F (37.9 C) (Oral)   Resp 18   Ht 5\' 2"  (1.575 m)   Wt 68 kg   SpO2 95%   BMI 27.44 kg/m  Physical Exam Constitutional:      Appearance: She is well-developed.  HENT:     Head: Normocephalic and atraumatic.     Right Ear: Tympanic membrane and ear canal normal.     Left Ear: Tympanic membrane and ear canal normal.     Nose: Mucosal edema present. No rhinorrhea.     Mouth/Throat:     Mouth: Mucous membranes are moist.     Pharynx: Oropharynx is clear. Uvula midline. No oropharyngeal exudate or posterior oropharyngeal erythema.     Tonsils: No tonsillar abscesses.  Eyes:     Conjunctiva/sclera: Conjunctivae normal.  Cardiovascular:     Rate and Rhythm: Normal rate.     Heart sounds: Normal heart sounds.  Pulmonary:     Effort: Pulmonary effort is normal. No respiratory distress.     Breath sounds: No wheezing or rales.  Musculoskeletal:  General: Normal range of motion.  Skin:    General: Skin is warm and dry.     Findings: No rash.  Neurological:     Mental Status: She is alert and oriented to person, place, and time.     ED Results / Procedures / Treatments   Labs (all labs ordered are listed, but only abnormal results are displayed) Labs Reviewed  SARS CORONAVIRUS 2 BY RT PCR - Abnormal; Notable for the following components:      Result Value   SARS Coronavirus 2 by RT PCR POSITIVE (*)    All other components within normal limits    EKG None  Radiology DG Chest Portable 1 View  Result Date: 09/05/2022 CLINICAL DATA:  cough EXAM: PORTABLE CHEST 1 VIEW  COMPARISON:  12/28/2016 FINDINGS: Bilateral lung fields are clear. Bilateral costophrenic angles are clear. Normal cardio-mediastinal silhouette. No acute osseous abnormalities. The soft tissues are within normal limits. IMPRESSION: No active disease. Electronically Signed   By: Jules Schick M.D.   On: 09/05/2022 16:12    Procedures Procedures    Medications Ordered in ED Medications  benzonatate (TESSALON) capsule 200 mg (200 mg Oral Given 09/05/22 1550)  acetaminophen (TYLENOL) tablet 650 mg (650 mg Oral Given 09/05/22 1611)    ED Course/ Medical Decision Making/ A&P                                 Medical Decision Making Patient testing positive for COVID-19 with recent exposure.  She denies shortness of breath, nausea or vomiting, although has had a reduced appetite has been able to tolerate p.o. intake.  Low-grade subjective fever.  Exam is reassuring, she is ambulatory in the department without shortness of breath.  Chest x-ray is negative for COVID pneumonia.  She did spike a fever to 100.2 here, she was given Tylenol, also given Tessalon prior to discharge as she describes a tickle sensation causing need for cough.  We discussed pros and cons of Paxlovid, patient defers this medication at this time.  Return precautions were outlined, she was stable at time of discharge.  Amount and/or Complexity of Data Reviewed Labs: ordered.    Details: Positive COVID-19 Radiology: ordered and independent interpretation performed.    Details: Reviewed and agree with interpretation.  Risk OTC drugs. Prescription drug management.           Final Clinical Impression(s) / ED Diagnoses Final diagnoses:  COVID-19    Rx / DC Orders ED Discharge Orders          Ordered    benzonatate (TESSALON) 100 MG capsule  3 times daily PRN        09/05/22 1635              Burgess Amor, PA-C 09/05/22 1643    Pricilla Loveless, MD 09/07/22 [phone]

## 2022-09-05 NOTE — ED Notes (Signed)
PA at bedside.

## 2022-09-05 NOTE — ED Notes (Signed)
Temp increased to 100.2 Pt stated she did not take any tylenol today  Informed PA

## 2022-09-05 NOTE — Discharge Instructions (Signed)
Rest make sure you are drinking plenty of fluids.  Do continue taking Tylenol or Motrin for body aches and fever reduction.  You have been prescribed Tessalon which hopefully will help you with your cough.  Your chest x-ray is clear today.  Get rechecked however if you develop any increasing shortness of breath, worsening weakness or dizziness.

## 2022-09-05 NOTE — ED Notes (Signed)
Introduced self to pt Pt able to walk from lobby to ED room 23 Pt stated that Friday she started with a cough and HA. Cough is dry and scratchy.  Denies fevers.   Attached to partial monitor Call bell on stretcher.   COVID test pending Waiting on results and further orders

## 2022-09-21 ENCOUNTER — Telehealth: Payer: Self-pay | Admitting: *Deleted

## 2022-09-21 NOTE — Telephone Encounter (Signed)
Transition Care Management Unsuccessful Follow-up Telephone Call  Date of discharge and from where:  [facility]  09/05/2022  Attempts:  1st Attempt  Reason for unsuccessful TCM follow-up call:  No answer/busy

## 2022-10-03 DIAGNOSIS — I10 Essential (primary) hypertension: Secondary | ICD-10-CM | POA: Diagnosis not present

## 2022-10-03 DIAGNOSIS — Z23 Encounter for immunization: Secondary | ICD-10-CM | POA: Diagnosis not present

## 2022-10-03 DIAGNOSIS — E7849 Other hyperlipidemia: Secondary | ICD-10-CM | POA: Diagnosis not present

## 2023-02-06 DIAGNOSIS — Z0000 Encounter for general adult medical examination without abnormal findings: Secondary | ICD-10-CM | POA: Diagnosis not present

## 2023-02-06 DIAGNOSIS — E559 Vitamin D deficiency, unspecified: Secondary | ICD-10-CM | POA: Diagnosis not present

## 2023-02-06 DIAGNOSIS — Z131 Encounter for screening for diabetes mellitus: Secondary | ICD-10-CM | POA: Diagnosis not present

## 2023-02-06 DIAGNOSIS — E7849 Other hyperlipidemia: Secondary | ICD-10-CM | POA: Diagnosis not present

## 2023-02-06 DIAGNOSIS — N390 Urinary tract infection, site not specified: Secondary | ICD-10-CM | POA: Diagnosis not present

## 2023-02-06 DIAGNOSIS — I10 Essential (primary) hypertension: Secondary | ICD-10-CM | POA: Diagnosis not present

## 2023-02-06 DIAGNOSIS — Z1239 Encounter for other screening for malignant neoplasm of breast: Secondary | ICD-10-CM | POA: Diagnosis not present

## 2023-02-27 DIAGNOSIS — I10 Essential (primary) hypertension: Secondary | ICD-10-CM | POA: Diagnosis not present

## 2023-02-27 DIAGNOSIS — E7849 Other hyperlipidemia: Secondary | ICD-10-CM | POA: Diagnosis not present

## 2023-05-31 ENCOUNTER — Other Ambulatory Visit (HOSPITAL_COMMUNITY): Payer: Self-pay | Admitting: Interventional Radiology

## 2023-05-31 DIAGNOSIS — I671 Cerebral aneurysm, nonruptured: Secondary | ICD-10-CM

## 2023-05-31 DIAGNOSIS — R519 Headache, unspecified: Secondary | ICD-10-CM

## 2023-06-05 ENCOUNTER — Ambulatory Visit (HOSPITAL_COMMUNITY)
Admission: RE | Admit: 2023-06-05 | Discharge: 2023-06-05 | Disposition: A | Discharge status: Home / Self Care | Source: Ambulatory Visit | Attending: Interventional Radiology | Admitting: Interventional Radiology

## 2023-06-05 DIAGNOSIS — R519 Headache, unspecified: Secondary | ICD-10-CM | POA: Insufficient documentation

## 2023-06-05 DIAGNOSIS — I671 Cerebral aneurysm, nonruptured: Secondary | ICD-10-CM | POA: Diagnosis present

## 2023-06-30 ENCOUNTER — Encounter (HOSPITAL_COMMUNITY): Payer: Self-pay | Admitting: Interventional Radiology

## 2023-07-04 ENCOUNTER — Telehealth (HOSPITAL_COMMUNITY): Payer: Self-pay

## 2023-07-04 NOTE — Telephone Encounter (Signed)
-----   Message from [facility] GORMAN LAMP sent at 07/04/2023  2:47 PM EDT ----- Regarding: Dev F/U [facility] in 1 year

## 2023-07-04 NOTE — Telephone Encounter (Signed)
 Pt is aware that Dr. Dolphus is leaving and agreed to f/u with PCP for yearly f/u. AB
